# Patient Record
Sex: Female | Born: 1970 | Race: White | Hispanic: No | Marital: Single | Smoking: Never smoker
Health system: Southern US, Community
[De-identification: ages and names within clinical notes are randomized; demographics above are authoritative.]

## PROBLEM LIST (undated history)

## (undated) DIAGNOSIS — M109 Gout, unspecified: Secondary | ICD-10-CM

## (undated) DIAGNOSIS — G43909 Migraine, unspecified, not intractable, without status migrainosus: Secondary | ICD-10-CM

## (undated) DIAGNOSIS — I10 Essential (primary) hypertension: Secondary | ICD-10-CM

## (undated) DIAGNOSIS — N159 Renal tubulo-interstitial disease, unspecified: Secondary | ICD-10-CM

## (undated) DIAGNOSIS — A4902 Methicillin resistant Staphylococcus aureus infection, unspecified site: Secondary | ICD-10-CM

## (undated) HISTORY — PX: OTHER SURGICAL HISTORY: SHX169

---

## 2003-02-04 ENCOUNTER — Emergency Department (HOSPITAL_COMMUNITY): Admission: EM | Admit: 2003-02-04 | Discharge: 2003-02-04 | Payer: Self-pay | Admitting: Emergency Medicine

## 2006-01-08 ENCOUNTER — Emergency Department (HOSPITAL_COMMUNITY): Admission: EM | Admit: 2006-01-08 | Discharge: 2006-01-08 | Payer: Self-pay | Admitting: Emergency Medicine

## 2007-10-24 ENCOUNTER — Emergency Department (HOSPITAL_COMMUNITY): Admission: EM | Admit: 2007-10-24 | Discharge: 2007-10-24 | Payer: Self-pay | Admitting: Emergency Medicine

## 2007-10-31 ENCOUNTER — Ambulatory Visit (HOSPITAL_COMMUNITY): Admission: RE | Admit: 2007-10-31 | Discharge: 2007-10-31 | Payer: Self-pay | Admitting: General Surgery

## 2007-10-31 ENCOUNTER — Encounter (INDEPENDENT_AMBULATORY_CARE_PROVIDER_SITE_OTHER): Payer: Self-pay | Admitting: General Surgery

## 2008-03-05 ENCOUNTER — Emergency Department (HOSPITAL_COMMUNITY): Admission: EM | Admit: 2008-03-05 | Discharge: 2008-03-05 | Payer: Self-pay | Admitting: Emergency Medicine

## 2008-04-08 ENCOUNTER — Emergency Department (HOSPITAL_COMMUNITY): Admission: EM | Admit: 2008-04-08 | Discharge: 2008-04-08 | Payer: Self-pay | Admitting: Emergency Medicine

## 2008-07-09 ENCOUNTER — Emergency Department (HOSPITAL_COMMUNITY): Admission: EM | Admit: 2008-07-09 | Discharge: 2008-07-09 | Payer: Self-pay | Admitting: Emergency Medicine

## 2008-11-09 ENCOUNTER — Emergency Department (HOSPITAL_COMMUNITY): Admission: EM | Admit: 2008-11-09 | Discharge: 2008-11-09 | Payer: Self-pay | Admitting: Emergency Medicine

## 2009-03-07 ENCOUNTER — Emergency Department (HOSPITAL_COMMUNITY): Admission: EM | Admit: 2009-03-07 | Discharge: 2009-03-07 | Payer: Self-pay | Admitting: Emergency Medicine

## 2009-07-30 ENCOUNTER — Emergency Department (HOSPITAL_COMMUNITY): Admission: EM | Admit: 2009-07-30 | Discharge: 2009-07-30 | Payer: Self-pay | Admitting: Emergency Medicine

## 2010-06-18 ENCOUNTER — Emergency Department (HOSPITAL_COMMUNITY)
Admission: EM | Admit: 2010-06-18 | Discharge: 2010-06-18 | Payer: Self-pay | Source: Home / Self Care | Admitting: Emergency Medicine

## 2010-06-21 ENCOUNTER — Emergency Department (HOSPITAL_COMMUNITY)
Admission: EM | Admit: 2010-06-21 | Discharge: 2010-06-21 | Payer: Self-pay | Source: Home / Self Care | Admitting: Emergency Medicine

## 2010-07-28 ENCOUNTER — Emergency Department (HOSPITAL_COMMUNITY)
Admission: EM | Admit: 2010-07-28 | Discharge: 2010-07-28 | Disposition: A | Discharge status: Home / Self Care | Payer: Self-pay | Attending: Emergency Medicine | Admitting: Emergency Medicine

## 2010-07-28 DIAGNOSIS — N949 Unspecified condition associated with female genital organs and menstrual cycle: Secondary | ICD-10-CM | POA: Insufficient documentation

## 2010-07-28 DIAGNOSIS — N938 Other specified abnormal uterine and vaginal bleeding: Secondary | ICD-10-CM | POA: Insufficient documentation

## 2010-07-28 LAB — CBC
HCT: 40.5 % (ref 36.0–46.0)
MCH: 30.4 pg (ref 26.0–34.0)
RBC: 4.64 MIL/uL (ref 3.87–5.11)
RDW: 12.7 % (ref 11.5–15.5)
WBC: 9.2 10*3/uL (ref 4.0–10.5)

## 2010-07-28 LAB — BASIC METABOLIC PANEL
Calcium: 9 mg/dL (ref 8.4–10.5)
Creatinine, Ser: 0.8 mg/dL (ref 0.4–1.2)
GFR calc Af Amer: >60 mL/min (ref >60)
GFR calc non Af Amer: >60 mL/min (ref >60)
Glucose, Bld: 89 mg/dL (ref 70–99)
Sodium: 138 mEq/L (ref 135–145)

## 2010-07-28 LAB — URINALYSIS, ROUTINE W REFLEX MICROSCOPIC
Specific Gravity, Urine: >1.03 — ABNORMAL HIGH (ref 1.005–1.030)
Urobilinogen, UA: 0.2 mg/dL (ref 0.0–1.0)

## 2010-07-28 LAB — DIFFERENTIAL
Basophils Absolute: 0 10*3/uL (ref 0.0–0.1)
Basophils Relative: 0 % (ref 0–1)
Eosinophils Absolute: 0.1 10*3/uL (ref 0.0–0.7)
Neutro Abs: 5.7 10*3/uL (ref 1.7–7.7)

## 2010-07-28 LAB — URINE MICROSCOPIC-ADD ON

## 2010-07-28 LAB — POCT PREGNANCY, URINE: Preg Test, Ur: NEGATIVE

## 2010-09-05 ENCOUNTER — Emergency Department (HOSPITAL_COMMUNITY)
Admission: EM | Admit: 2010-09-05 | Discharge: 2010-09-05 | Disposition: A | Discharge status: Home / Self Care | Payer: Self-pay | Attending: Emergency Medicine | Admitting: Emergency Medicine

## 2010-09-05 DIAGNOSIS — I10 Essential (primary) hypertension: Secondary | ICD-10-CM | POA: Insufficient documentation

## 2010-09-05 DIAGNOSIS — N390 Urinary tract infection, site not specified: Secondary | ICD-10-CM | POA: Insufficient documentation

## 2010-09-05 DIAGNOSIS — M109 Gout, unspecified: Secondary | ICD-10-CM | POA: Insufficient documentation

## 2010-09-05 DIAGNOSIS — E876 Hypokalemia: Secondary | ICD-10-CM | POA: Insufficient documentation

## 2010-09-05 DIAGNOSIS — R51 Headache: Secondary | ICD-10-CM | POA: Insufficient documentation

## 2010-09-05 LAB — URINE MICROSCOPIC-ADD ON

## 2010-09-05 LAB — URINALYSIS, ROUTINE W REFLEX MICROSCOPIC
Glucose, UA: NEGATIVE mg/dL
Ketones, ur: NEGATIVE mg/dL
Leukocytes, UA: NEGATIVE
Nitrite: POSITIVE — AB
Specific Gravity, Urine: >1.03 — ABNORMAL HIGH (ref 1.005–1.030)
pH: 6 (ref 5.0–8.0)

## 2010-09-05 LAB — BASIC METABOLIC PANEL
Calcium: 7.8 mg/dL — ABNORMAL LOW (ref 8.4–10.5)
Glucose, Bld: 79 mg/dL (ref 70–99)

## 2010-09-05 LAB — POCT PREGNANCY, URINE: Preg Test, Ur: NEGATIVE

## 2010-09-06 LAB — BASIC METABOLIC PANEL
BUN: 19 mg/dL (ref 6–23)
CO2: 28 mEq/L (ref 19–32)
Chloride: 103 mEq/L (ref 96–112)
Creatinine, Ser: 1.03 mg/dL (ref 0.4–1.2)
GFR calc non Af Amer: 60 mL/min — ABNORMAL LOW (ref >60)
Glucose, Bld: 126 mg/dL — ABNORMAL HIGH (ref 70–99)
Potassium: 3.1 mEq/L — ABNORMAL LOW (ref 3.5–5.1)
Sodium: 136 mEq/L (ref 135–145)

## 2010-09-06 LAB — URINE MICROSCOPIC-ADD ON

## 2010-09-06 LAB — URINALYSIS, ROUTINE W REFLEX MICROSCOPIC
Glucose, UA: NEGATIVE mg/dL
Leukocytes, UA: NEGATIVE
Protein, ur: NEGATIVE mg/dL

## 2010-09-06 LAB — URINE CULTURE
Colony Count: NO GROWTH
Culture: NO GROWTH

## 2010-09-06 LAB — DIFFERENTIAL
Basophils Relative: 1 % (ref 0–1)
Lymphs Abs: 2.4 10*3/uL (ref 0.7–4.0)
Neutro Abs: 5.3 10*3/uL (ref 1.7–7.7)

## 2010-09-06 LAB — CBC
MCV: 89.3 fL (ref 78.0–100.0)
Platelets: 307 10*3/uL (ref 150–400)
RBC: 4.51 MIL/uL (ref 3.87–5.11)

## 2010-09-06 LAB — RAPID URINE DRUG SCREEN, HOSP PERFORMED
Barbiturates: NOT DETECTED
Tetrahydrocannabinol: NOT DETECTED

## 2010-09-06 LAB — PREGNANCY, URINE: Preg Test, Ur: NEGATIVE

## 2010-09-11 LAB — URINE CULTURE: Culture  Setup Time: 201204100014

## 2010-10-12 NOTE — Op Note (Signed)
Joanna Perry, Joanna Perry            ACCOUNT NO.:  [id number]   MEDICAL RECORD NO.:  [id number]          PATIENT TYPE:  AMB   LOCATION:  DAY                           FACILITY:  [facility]   PHYSICIAN:  Barbaraann Barthel, M.D. DATE OF BIRTH:  July 18, 1970   DATE OF PROCEDURE:  10/31/2007  DATE OF DISCHARGE:                               OPERATIVE REPORT   PREOPERATIVE DIAGNOSIS:  Left posterior knee abscess.   POSTOPERATIVE DIAGNOSIS:  Left posterior knee abscess.   PROCEDURE:  incision and drainage and debridement of left posterior knee  abscess.   NOTE:  This is a 40 year old white female who was seen multiple times in  [place] in the emergency room and could not get an appointment with a  surgeon there in [place] and came to our office and we scheduled her for  excision of an obvious necrotic abscess on the left posterior knee.  We  discussed complications not limited to but including bleeding,  infection, and the possibility of further surgery might be required.  Informed consent was obtained.   SPECIMENS:  Debrided tissue and cultures.   TECHNIQUE:  The patient was placed in the right lateral decubitus  position.  We prepped the left leg with Betadine solution and draped in  the usual manner after general LMA anesthesia was obtained.  We excised  and debrided and curetted the abscess cavity after cultures were  obtained and controlled the bleeding with a cautery device and packed  the wound open with saline-soaked gauze.  We made followup arrangements  for wound care.  We will follow her postoperatively as needed for wound  care.      Barbaraann Barthel, M.D.  Electronically Signed     WB/MEDQ  D:  10/31/2007  T:  11/01/2007  Job:  [id number]   cc:   Mariella Saa, MD  [facility]  [address] [place], [place] [address]

## 2011-02-24 LAB — DIFFERENTIAL
Basophils Absolute: 0
Eosinophils Absolute: 0.1
Lymphs Abs: 1.9
Neutrophils Relative %: 59

## 2011-02-24 LAB — BASIC METABOLIC PANEL
BUN: 9
Calcium: 9.3
Chloride: 108
GFR calc Af Amer: >60
Potassium: 4.1

## 2011-02-24 LAB — CULTURE, ROUTINE-ABSCESS: Gram Stain: NONE SEEN

## 2011-02-24 LAB — RAPID URINE DRUG SCREEN, HOSP PERFORMED
Amphetamines: NOT DETECTED
Barbiturates: NOT DETECTED
Opiates: POSITIVE — AB

## 2011-02-24 LAB — ANAEROBIC CULTURE

## 2011-02-24 LAB — CBC
MCV: 88.3
Platelets: 339
RDW: 12.6
WBC: 6

## 2011-11-23 ENCOUNTER — Encounter (HOSPITAL_COMMUNITY): Payer: Self-pay | Admitting: *Deleted

## 2011-11-23 ENCOUNTER — Emergency Department (HOSPITAL_COMMUNITY)
Admission: EM | Admit: 2011-11-23 | Discharge: 2011-11-23 | Disposition: A | Discharge status: Home / Self Care | Payer: Self-pay | Attending: Emergency Medicine | Admitting: Emergency Medicine

## 2011-11-23 ENCOUNTER — Emergency Department (HOSPITAL_COMMUNITY): Payer: Self-pay

## 2011-11-23 DIAGNOSIS — G8929 Other chronic pain: Secondary | ICD-10-CM | POA: Insufficient documentation

## 2011-11-23 DIAGNOSIS — N390 Urinary tract infection, site not specified: Secondary | ICD-10-CM | POA: Insufficient documentation

## 2011-11-23 DIAGNOSIS — Z79899 Other long term (current) drug therapy: Secondary | ICD-10-CM | POA: Insufficient documentation

## 2011-11-23 HISTORY — DX: Renal tubulo-interstitial disease, unspecified: N15.9

## 2011-11-23 HISTORY — DX: Gout, unspecified: M10.9

## 2011-11-23 HISTORY — DX: Methicillin resistant Staphylococcus aureus infection, unspecified site: A49.02

## 2011-11-23 HISTORY — DX: Migraine, unspecified, not intractable, without status migrainosus: G43.909

## 2011-11-23 LAB — COMPREHENSIVE METABOLIC PANEL
AST: 12 U/L (ref 0–37)
Albumin: 3.4 g/dL — ABNORMAL LOW (ref 3.5–5.2)
BUN: 11 mg/dL (ref 6–23)
Calcium: 9.5 mg/dL (ref 8.4–10.5)
Chloride: 102 mEq/L (ref 96–112)
Creatinine, Ser: 0.9 mg/dL (ref 0.50–1.10)
Total Bilirubin: 0.2 mg/dL — ABNORMAL LOW (ref 0.3–1.2)

## 2011-11-23 LAB — URINALYSIS, ROUTINE W REFLEX MICROSCOPIC
Glucose, UA: NEGATIVE mg/dL
Ketones, ur: NEGATIVE mg/dL
Nitrite: NEGATIVE
Protein, ur: NEGATIVE mg/dL
pH: 6 (ref 5.0–8.0)

## 2011-11-23 LAB — LIPASE, BLOOD: Lipase: 29 U/L (ref 11–59)

## 2011-11-23 LAB — CBC WITH DIFFERENTIAL/PLATELET
Basophils Absolute: 0 10*3/uL (ref 0.0–0.1)
Basophils Relative: 0 % (ref 0–1)
Eosinophils Absolute: 0.1 10*3/uL (ref 0.0–0.7)
Eosinophils Relative: 2 % (ref 0–5)
HCT: 41.1 % (ref 36.0–46.0)
Hemoglobin: 14.3 g/dL (ref 12.0–15.0)
MCH: 30.2 pg (ref 26.0–34.0)
MCHC: 34.8 g/dL (ref 30.0–36.0)
MCV: 86.7 fL (ref 78.0–100.0)
Monocytes Absolute: 0.8 10*3/uL (ref 0.1–1.0)
Monocytes Relative: 8 % (ref 3–12)
RDW: 12.6 % (ref 11.5–15.5)

## 2011-11-23 LAB — TROPONIN I: Troponin I: <0.3 ng/mL (ref <0.30)

## 2011-11-23 MED ORDER — SODIUM CHLORIDE 0.9 % IV BOLUS (SEPSIS)
250.0000 mL | Freq: Once | INTRAVENOUS | Status: AC
  Administered 2011-11-23: 14:00:00 via INTRAVENOUS

## 2011-11-23 MED ORDER — SODIUM CHLORIDE 0.9 % IV SOLN: INTRAVENOUS | Status: DC

## 2011-11-23 MED ORDER — HYDROMORPHONE HCL PF 1 MG/ML IJ SOLN
1.0000 mg | Freq: Once | INTRAMUSCULAR | Status: AC
  Administered 2011-11-23: 1 mg via INTRAVENOUS
  Filled 2011-11-23: qty 1

## 2011-11-23 MED ORDER — ONDANSETRON HCL 4 MG/2ML IJ SOLN
4.0000 mg | Freq: Once | INTRAMUSCULAR | Status: AC
  Administered 2011-11-23: 4 mg via INTRAVENOUS
  Filled 2011-11-23: qty 2

## 2011-11-23 MED ORDER — FLUCONAZOLE 100 MG PO TABS
100.0000 mg | ORAL_TABLET | Freq: Once | ORAL | Status: AC
  Administered 2011-11-23: 100 mg via ORAL
  Filled 2011-11-23: qty 1

## 2011-11-23 MED ORDER — CEPHALEXIN 500 MG PO CAPS: 500.0000 mg | ORAL_CAPSULE | Freq: Four times a day (QID) | ORAL | Status: AC

## 2011-11-23 MED ORDER — METRONIDAZOLE 500 MG PO TABS
2000.0000 mg | ORAL_TABLET | Freq: Once | ORAL | Status: AC
  Administered 2011-11-23: 2000 mg via ORAL
  Filled 2011-11-23: qty 4

## 2011-11-23 MED ORDER — AZITHROMYCIN 250 MG PO TABS
1000.0000 mg | ORAL_TABLET | Freq: Once | ORAL | Status: AC
  Administered 2011-11-23: 1000 mg via ORAL
  Filled 2011-11-23: qty 4

## 2011-11-23 MED ORDER — DEXTROSE 5 % IV SOLN
1.0000 g | Freq: Once | INTRAVENOUS | Status: AC
  Administered 2011-11-23: 1 g via INTRAVENOUS
  Filled 2011-11-23: qty 10

## 2011-11-23 MED ORDER — OXYCODONE-ACETAMINOPHEN 5-325 MG PO TABS: 1.0000 | ORAL_TABLET | ORAL | Status: AC | PRN

## 2011-11-23 NOTE — ED Notes (Signed)
Rt flank pain for 3 days,  Diarrhea for 1 week, NV since yesterday. No injury.

## 2011-11-23 NOTE — ED Notes (Signed)
edp stated to given meds as ordered. Did not think she had an allergic reaction on previous visit

## 2011-11-23 NOTE — Discharge Instructions (Signed)
Workup today looks like urinary tract infection take antibiotic as directed return to emergency part not improving in 2 days. Take pain medicine as directed. Other medications were given you in the emergency department to cover Trichomonas or other STDs. Given a dose of Rocephin IV piggyback in the emergency department a jump start the healing of the urinary tract infection.Urinary Tract Infection A urinary tract infection (UTI) is often caused by a germ (bacteria). A UTI is usually helped with medicine (antibiotics) that kills germs. Take all the medicine until it is gone. Do this even if you are feeling better. You are usually better in 7 to 10 days. HOME CARE   Drink enough water and fluids to keep your pee (urine) clear or pale yellow. Drink:   Cranberry juice.   Water.   Avoid:   Caffeine.   Tea.   Bubbly (carbonated) drinks.   Alcohol.   Only take medicine as told by your doctor.   To prevent further infections:   Pee often.   After pooping (bowel movement), women should wipe from front to back. Use each tissue only once.   Pee before and after having sex (intercourse).  Ask your doctor when your test results will be ready. Make sure you follow up and get your test results.  GET HELP RIGHT AWAY IF:   There is very bad back pain or lower belly (abdominal) pain.   You get the chills.   You have a fever.   Your baby is older than 3 months with a rectal temperature of 102 F (38.9 C) or higher.   Your baby is 30 months old or younger with a rectal temperature of 100.4 F (38 C) or higher.   You feel sick to your stomach (nauseous) or throw up (vomit).   There is continued burning with peeing.   Your problems are not better in 3 days. Return sooner if you are getting worse.  MAKE SURE YOU:   Understand these instructions.   Will watch your condition.   Will get help right away if you are not doing well or get worse.  Document Released: 11/02/2007 Document  Revised: 05/05/2011 Document Reviewed: 11/02/2007 [facility] Patient Information 2012 [place], [place].

## 2011-11-23 NOTE — ED Provider Notes (Signed)
History   This chart was scribed for Shelda Jakes, MD by Clarita Crane. The patient was seen in room [facility]. Patient's care was started at 1322.    CSN: [id number]  Arrival date & time 11/23/11  1322   First MD Initiated Contact with Patient 11/23/11 1335      Chief Complaint  Patient presents with  . Flank Pain    (Consider location/radiation/quality/duration/timing/severity/associated sxs/prior treatment) HPI Joanna Perry is a 41 y.o. female who presents to the Emergency Department complaining of recurrence of chronic moderate left sided flank pain onset 4 days ago and persistent since with associated intermittent fever, visual disturbances, dysuria, abdominal bloating and pain, nausea, vomiting, diarrhea and hematochezia. Patient note she has experienced episodes of similar flank pain for the past 3-4 years. Patient also reports experiencing constant chest pain localized to left side of chest for the past 2 days. Denies fever, chills, hematemesis, sore throat, congestion, rash, bleeding easily, myalgias. Patient admits to using cocaine 2 weeks ago. Patient also notes she is currently being evaluated for abnormal vaginal bleeding (menstrual period every other week) and is recommended to have abdominal hysterectomy). Patient with h/o migraine, kidney infection, MRSA infection, gout, D and C x8.  Past Medical History  Diagnosis Date  . Gout   . Migraine   . Kidney infection   . MRSA infection     History reviewed. No pertinent past surgical history.  History reviewed. No pertinent family history.  History  Substance Use Topics  . Smoking status: Not on file  . Smokeless tobacco: Not on file  . Alcohol Use: No    OB History    Grav Para Term Preterm Abortions TAB SAB Ect Mult Living                  Review of Systems  Constitutional: Positive for fever. Negative for chills.  HENT: Negative for congestion, sore throat, rhinorrhea and neck pain.   Eyes:  Positive for visual disturbance. Negative for pain.  Respiratory: Negative for cough and shortness of breath.   Cardiovascular: Positive for chest pain. Negative for leg swelling.  Gastrointestinal: Positive for nausea, vomiting, abdominal pain, diarrhea and blood in stool.  Genitourinary: Positive for dysuria and flank pain.  Musculoskeletal: Negative for myalgias and back pain.  Skin: Negative for rash.  Neurological: Negative for weakness and headaches.    Allergies  Codeine and Tramadol  Home Medications   Current Outpatient Rx  Name Route Sig Dispense Refill  . AMLODIPINE BESYLATE 10 MG PO TABS Oral Take 10 mg by mouth daily.    Marland Kitchen CITALOPRAM HYDROBROMIDE 20 MG PO TABS Oral Take 20 mg by mouth daily.    Marland Kitchen CLONAZEPAM 1 MG PO TABS Oral Take 1 mg by mouth 2 (two) times daily as needed.    Marland Kitchen DIPHENHYDRAMINE HCL 25 MG PO CAPS Oral Take 25 mg by mouth every 6 (six) hours as needed. For allergic reaction    . HYDROXYZINE PAMOATE 25 MG PO CAPS Oral Take 25 mg by mouth at bedtime as needed. For sleep    . LISINOPRIL 20 MG PO TABS Oral Take 20 mg by mouth daily.    . TOPAMAX PO Oral Take 1 tablet by mouth daily.    . CEPHALEXIN 500 MG PO CAPS Oral Take 1 capsule (500 mg total) by mouth 4 (four) times daily. 28 capsule 0    BP 135/95  Pulse 82  Temp 97.9 F (36.6 C) (Oral)  Resp 20  Ht 5\' 1"  (1.549 m)  Wt 165 lb (74.844 kg)  BMI 31.18 kg/m2  SpO2 100%  LMP 10/23/2011  Physical Exam  Nursing note and vitals reviewed. Constitutional: She is oriented to person, place, and time. She appears well-developed and well-nourished. No distress.  HENT:  Head: Normocephalic and atraumatic.  Mouth/Throat: Oropharynx is clear and moist.       Mucous membranes moist.   Eyes: EOM are normal. Pupils are equal, round, and reactive to light.  Neck: Neck supple. No tracheal deviation present.  Cardiovascular: Normal rate and regular rhythm.  Exam reveals no gallop and no friction rub.   No murmur  heard. Pulmonary/Chest: Effort normal. No respiratory distress. She has no wheezes. She has no rales.  Abdominal: Soft. She exhibits no distension. There is tenderness (left flank) in the left lower quadrant.  Musculoskeletal: Normal range of motion. She exhibits no edema.  Neurological: She is alert and oriented to person, place, and time. No cranial nerve deficit or sensory deficit.  Skin: Skin is warm and dry.  Psychiatric: She has a normal mood and affect. Her behavior is normal.    ED Course  Procedures (including critical care time)  DIAGNOSTIC STUDIES: Oxygen Saturation is 100% on room air, normal by my interpretation.    COORDINATION OF CARE: 1:54PM-Patient informed of current plan for treatment and evaluation and agrees with plan at this time.  2:37PM- Shelda Jakes, MD to patient bedside to verify allergies.   Date: 11/23/2011  Rate: 82  Rhythm: normal sinus rhythm  QRS Axis: normal  Intervals: normal  ST/T Wave abnormalities: normal  Conduction Disutrbances:none  Narrative Interpretation:   Old EKG Reviewed: unchanged From 11/09/08   Labs Reviewed  COMPREHENSIVE METABOLIC PANEL - Abnormal; Notable for the following:    Glucose, Bld 102 (*)     Albumin 3.4 (*)     Total Bilirubin 0.2 (*)     GFR calc non Af Amer 78 (*)     All other components within normal limits  URINALYSIS, ROUTINE W REFLEX MICROSCOPIC - Abnormal; Notable for the following:    Leukocytes, UA SMALL (*)     All other components within normal limits  URINE MICROSCOPIC-ADD ON - Abnormal; Notable for the following:    Squamous Epithelial / LPF MANY (*)     Bacteria, UA MANY (*)     All other components within normal limits  CBC WITH DIFFERENTIAL  LIPASE, BLOOD  TROPONIN I  PREGNANCY, URINE  URINE CULTURE   Dg Abd Acute W/chest  11/23/2011  *RADIOLOGY REPORT*  Clinical Data: Left abdominal pain.  History kidney stones.  ACUTE ABDOMEN SERIES (ABDOMEN 2 VIEW & CHEST 1 VIEW)  Comparison:  08/11/2011  Findings: Chest radiograph demonstrates clear lungs. Heart and mediastinum are within normal limits.  The trachea is midline. There is stool in the rectal region and right colon.  No significant small bowel gas.  Limited evaluation for kidney stones.  IMPRESSION: No acute chest findings.  Nonobstructive bowel gas pattern.  Colonic stool.  Original Report Authenticated By: Richarda Overlie, M.D.   Results for orders placed during the hospital encounter of 11/23/11  CBC WITH DIFFERENTIAL      Component Value Range   WBC 9.2  4.0 - 10.5 K/uL   RBC 4.74  3.87 - 5.11 MIL/uL   Hemoglobin 14.3  12.0 - 15.0 g/dL   HCT 16.1  09.6 - 04.5 %   MCV 86.7  78.0 - 100.0 fL   MCH  30.2  26.0 - 34.0 pg   MCHC 34.8  30.0 - 36.0 g/dL   RDW 13.0  86.5 - 78.4 %   Platelets 298  150 - 400 K/uL   Neutrophils Relative 64  43 - 77 %   Neutro Abs 5.9  1.7 - 7.7 K/uL   Lymphocytes Relative 26  12 - 46 %   Lymphs Abs 2.4  0.7 - 4.0 K/uL   Monocytes Relative 8  3 - 12 %   Monocytes Absolute 0.8  0.1 - 1.0 K/uL   Eosinophils Relative 2  0 - 5 %   Eosinophils Absolute 0.1  0.0 - 0.7 K/uL   Basophils Relative 0  0 - 1 %   Basophils Absolute 0.0  0.0 - 0.1 K/uL  COMPREHENSIVE METABOLIC PANEL      Component Value Range   Sodium 136  135 - 145 mEq/L   Potassium 3.7  3.5 - 5.1 mEq/L   Chloride 102  96 - 112 mEq/L   CO2 23  19 - 32 mEq/L   Glucose, Bld 102 (*) 70 - 99 mg/dL   BUN 11  6 - 23 mg/dL   Creatinine, Ser 6.96  0.50 - 1.10 mg/dL   Calcium 9.5  8.4 - 29.5 mg/dL   Total Protein 7.1  6.0 - 8.3 g/dL   Albumin 3.4 (*) 3.5 - 5.2 g/dL   AST 12  0 - 37 U/L   ALT 13  0 - 35 U/L   Alkaline Phosphatase 61  39 - 117 U/L   Total Bilirubin 0.2 (*) 0.3 - 1.2 mg/dL   GFR calc non Af Amer 78 (*) >90 mL/min   GFR calc Af Amer >90  >90 mL/min  LIPASE, BLOOD      Component Value Range   Lipase 29  11 - 59 U/L  URINALYSIS, ROUTINE W REFLEX MICROSCOPIC      Component Value Range   Color, Urine YELLOW  YELLOW    APPearance CLEAR  CLEAR   Specific Gravity, Urine 1.025  1.005 - 1.030   pH 6.0  5.0 - 8.0   Glucose, UA NEGATIVE  NEGATIVE mg/dL   Hgb urine dipstick NEGATIVE  NEGATIVE   Bilirubin Urine NEGATIVE  NEGATIVE   Ketones, ur NEGATIVE  NEGATIVE mg/dL   Protein, ur NEGATIVE  NEGATIVE mg/dL   Urobilinogen, UA 0.2  0.0 - 1.0 mg/dL   Nitrite NEGATIVE  NEGATIVE   Leukocytes, UA SMALL (*) NEGATIVE  TROPONIN I      Component Value Range   Troponin I <0.30  <0.30 ng/mL  PREGNANCY, URINE      Component Value Range   Preg Test, Ur NEGATIVE  NEGATIVE  URINE MICROSCOPIC-ADD ON      Component Value Range   Squamous Epithelial / LPF MANY (*) RARE   WBC, UA 3-6  <3 WBC/hpf   RBC / HPF 3-6  <3 RBC/hpf   Bacteria, UA MANY (*) RARE     1. Urinary tract infection       MDM  Patient with complaint of left flank pain history of urinary tract infections workup in the emergency room without significant findings except for abnormal urinalysis not consistent perhaps with urinary tract infection but did have many epithelial cells we'll treat as as such if not better in 2 days will have her return CT scan of the abdomen required at that time. No acute surgical abdomen at this point. No leukocytosis. CT chest is negative patient also  complained of chest pain troponin is negative patient later stated that her boyfriend has had STDs and has not been treated therefore go ahead and add on Zithromax Flagyl to cover Chlamydia and Trichomonas. No evidence for pneumonias in the urine. Patient denies any vaginal discharge. Symptoms could be consistent with a left-sided kidney stone x-rays without evidence of pneumonia bowel obstruction or any other abnormalities. If related to kidney stone pain medicine may help that if not better in 2 days patient will return to emergency car for further workup. Today her CT scan is not functional the patient's condition does not ward transfer to [facility] at this time to have CT scan  done.   I personally performed the services described in this documentation, which was scribed in my presence. The recorded information has been reviewed and considered.     Shelda Jakes, MD 11/23/11 [phone]

## 2011-11-23 NOTE — ED Notes (Signed)
pts meds held at this time, ?allergy to dilaudid, md notifiy and will speak to pt

## 2011-11-23 NOTE — ED Notes (Signed)
Patient with no complaints at this time. Respirations even and unlabored. Skin warm/dry. Discharge instructions reviewed with patient at this time. Patient given opportunity to voice concerns/ask questions. IV removed per policy and band-aid applied to site. Patient discharged at this time and left Emergency Department with steady gait.  

## 2011-11-23 NOTE — ED Notes (Signed)
Pt stated she has been using crack for 20 years or more and "jst can't quit", "all my friends have stopped crack and use narcotics"  Family member at bedside throughout.

## 2011-11-23 NOTE — ED Notes (Signed)
Awaiting antibiotics to finish prior to discharge.

## 2011-11-25 LAB — URINE CULTURE: Colony Count: 50000

## 2011-12-26 ENCOUNTER — Encounter (HOSPITAL_COMMUNITY): Payer: Self-pay | Admitting: *Deleted

## 2011-12-26 ENCOUNTER — Emergency Department (HOSPITAL_COMMUNITY): Payer: Self-pay

## 2011-12-26 ENCOUNTER — Emergency Department (HOSPITAL_COMMUNITY)
Admission: EM | Admit: 2011-12-26 | Discharge: 2011-12-26 | Disposition: A | Discharge status: Home / Self Care | Payer: Self-pay | Attending: Emergency Medicine | Admitting: Emergency Medicine

## 2011-12-26 DIAGNOSIS — R109 Unspecified abdominal pain: Secondary | ICD-10-CM

## 2011-12-26 DIAGNOSIS — Z79899 Other long term (current) drug therapy: Secondary | ICD-10-CM | POA: Insufficient documentation

## 2011-12-26 DIAGNOSIS — R1012 Left upper quadrant pain: Secondary | ICD-10-CM | POA: Insufficient documentation

## 2011-12-26 LAB — COMPREHENSIVE METABOLIC PANEL
ALT: 10 U/L (ref 0–35)
AST: 15 U/L (ref 0–37)
Alkaline Phosphatase: 55 U/L (ref 39–117)
CO2: 26 mEq/L (ref 19–32)
Calcium: 9.1 mg/dL (ref 8.4–10.5)
Chloride: 105 mEq/L (ref 96–112)
GFR calc non Af Amer: >90 mL/min (ref >90)
Glucose, Bld: 91 mg/dL (ref 70–99)
Potassium: 3.8 mEq/L (ref 3.5–5.1)
Sodium: 139 mEq/L (ref 135–145)

## 2011-12-26 LAB — CBC WITH DIFFERENTIAL/PLATELET
Basophils Absolute: 0 10*3/uL (ref 0.0–0.1)
Eosinophils Relative: 2 % (ref 0–5)
Lymphocytes Relative: 31 % (ref 12–46)
Lymphs Abs: 2.1 10*3/uL (ref 0.7–4.0)
Neutro Abs: 4 10*3/uL (ref 1.7–7.7)
Neutrophils Relative %: 60 % (ref 43–77)
Platelets: 269 10*3/uL (ref 150–400)
RBC: 4.56 MIL/uL (ref 3.87–5.11)
RDW: 12.9 % (ref 11.5–15.5)
WBC: 6.7 10*3/uL (ref 4.0–10.5)

## 2011-12-26 LAB — URINALYSIS, ROUTINE W REFLEX MICROSCOPIC
Bilirubin Urine: NEGATIVE
Leukocytes, UA: NEGATIVE
Nitrite: NEGATIVE
Specific Gravity, Urine: 1.025 (ref 1.005–1.030)
Urobilinogen, UA: 0.2 mg/dL (ref 0.0–1.0)
pH: 6.5 (ref 5.0–8.0)

## 2011-12-26 LAB — RAPID URINE DRUG SCREEN, HOSP PERFORMED
Barbiturates: NOT DETECTED
Cocaine: POSITIVE — AB
Tetrahydrocannabinol: NOT DETECTED

## 2011-12-26 LAB — PREGNANCY, URINE: Preg Test, Ur: NEGATIVE

## 2011-12-26 MED ORDER — LISINOPRIL 20 MG PO TABS: 20.0000 mg | ORAL_TABLET | Freq: Every day | ORAL | Status: DC

## 2011-12-26 MED ORDER — OXYCODONE-ACETAMINOPHEN 5-325 MG PO TABS: 1.0000 | ORAL_TABLET | Freq: Four times a day (QID) | ORAL | Status: AC | PRN

## 2011-12-26 MED ORDER — AMLODIPINE BESYLATE 10 MG PO TABS: 10.0000 mg | ORAL_TABLET | Freq: Every day | ORAL | Status: DC

## 2011-12-26 MED ORDER — MORPHINE SULFATE 4 MG/ML IJ SOLN
4.0000 mg | Freq: Once | INTRAMUSCULAR | Status: AC
  Administered 2011-12-26: 4 mg via INTRAVENOUS
  Filled 2011-12-26: qty 1

## 2011-12-26 MED ORDER — IOHEXOL 300 MG/ML  SOLN
100.0000 mL | Freq: Once | INTRAMUSCULAR | Status: AC | PRN
  Administered 2011-12-26: 100 mL via INTRAVENOUS

## 2011-12-26 MED ORDER — SODIUM CHLORIDE 0.9 % IV BOLUS (SEPSIS)
500.0000 mL | Freq: Once | INTRAVENOUS | Status: AC
  Administered 2011-12-26: 11:00:00 via INTRAVENOUS

## 2011-12-26 MED ORDER — ONDANSETRON HCL 4 MG/2ML IJ SOLN
4.0000 mg | Freq: Once | INTRAMUSCULAR | Status: AC
  Administered 2011-12-26: 4 mg via INTRAVENOUS
  Filled 2011-12-26: qty 2

## 2011-12-26 MED ORDER — SODIUM CHLORIDE 0.9 % IV SOLN: INTRAVENOUS | Status: DC

## 2011-12-26 NOTE — ED Notes (Signed)
Pt to CT

## 2011-12-26 NOTE — ED Notes (Signed)
Pt states left flank pain for "months". Was here previously but CT was down. NAD.

## 2011-12-26 NOTE — ED Provider Notes (Signed)
History  This chart was scribed for [employer]. Rubin Payor, MD by Bennett Scrape. This patient was seen in room [facility]05/[facility]05 and the patient's care was started at 10:21AM.  CSN: [id number]  Arrival date & time 12/26/11  1006   First MD Initiated Contact with Patient 12/26/11 1021      Chief Complaint  Patient presents with  . Flank Pain    The history is provided by the patient. No language interpreter was used.   Joanna Perry is a 41 y.o. female who presents to the Emergency Department complaining of recurrent, non-changing constant left flank pain that radiates into the LUQ area with associated intermittent swelling for the past 2 to 3 months. The pain is worse after eating dairy products and greasy foods. She lists nausea, emesis and 29 lb weight gain as associated symptoms and states that the pain is improved after emesis episodes. She has a h/o crack use and relapsed 2 weeks ago to help ease her pain. She also c/o menstrual cycles every two weeks and states that she has been following up with her OBGYN for the symptoms. She states that her OBGYN's solution is a hysterectomy, but she reports that she needs a PCP to follow up with for this procedure. She also expresses concern over a clump of tissue that she passed last week that she attributes to a miscarriage. She denies taking a pregnancy test but states that she has had suspicions. She denies diarrhea, back pain, rash, fever and chills as associated symptoms. She has a h/o gout, migraine and MRSA. She denies smoking and alcohol use.   No PCP.  Past Medical History  Diagnosis Date  . Gout   . Migraine   . Kidney infection   . MRSA infection     History reviewed. No pertinent past surgical history.  No family history on file.  History  Substance Use Topics  . Smoking status: Never Smoker   . Smokeless tobacco: Not on file  . Alcohol Use: No    No OB history provided.  Review of Systems  Constitutional: Positive for  unexpected weight change. Negative for fever and chills.  Respiratory: Negative for cough and shortness of breath.   Gastrointestinal: Positive for nausea, vomiting and abdominal pain. Negative for diarrhea and blood in stool.  Genitourinary: Positive for flank pain and vaginal bleeding. Negative for vaginal discharge.  Musculoskeletal: Negative for back pain.  Skin: Negative for rash.    Allergies  Codeine; Other; Peach; Strawberry; and Tramadol  Home Medications   Current Outpatient Rx  Name Route Sig Dispense Refill  . CITALOPRAM HYDROBROMIDE 20 MG PO TABS Oral Take 20 mg by mouth daily.    Marland Kitchen CLONAZEPAM 1 MG PO TABS Oral Take 1 mg by mouth 2 (two) times daily as needed.    Marland Kitchen DIPHENHYDRAMINE HCL 25 MG PO CAPS Oral Take 25 mg by mouth every 6 (six) hours as needed. For allergic reaction    . HYDROXYZINE PAMOATE 25 MG PO CAPS Oral Take 25 mg by mouth at bedtime as needed. For sleep    . ADULT MULTIVITAMIN W/MINERALS CH Oral Take 1 tablet by mouth daily.    Marland Kitchen NAPROXEN SODIUM 220 MG PO TABS Oral Take 220 mg by mouth every 8 (eight) hours as needed. For pain    . POTASSIUM PO Oral Take 2 tablets by mouth daily.    Marland Kitchen VITAMIN C 500 MG PO TABS Oral Take 500 mg by mouth daily.    Marland Kitchen  AMLODIPINE BESYLATE 10 MG PO TABS Oral Take 1 tablet (10 mg total) by mouth daily. 14 tablet 0  . LISINOPRIL 20 MG PO TABS Oral Take 1 tablet (20 mg total) by mouth daily. 14 tablet 0  . OXYCODONE-ACETAMINOPHEN 5-325 MG PO TABS Oral Take 1-2 tablets by mouth every 6 (six) hours as needed for pain. 8 tablet 0    Triage Vitals: BP 153/106  Pulse 75  Temp 97.8 F (36.6 C) (Oral)  Resp 16  Ht 5\' 1"  (1.549 m)  Wt 165 lb (74.844 kg)  BMI 31.18 kg/m2  SpO2 96%  LMP 12/19/2011  Physical Exam  Nursing note and vitals reviewed. Constitutional: She is oriented to person, place, and time. She appears well-developed and well-nourished. No distress.  HENT:  Head: Normocephalic and atraumatic.  Eyes: EOM are normal.    Neck: Neck supple. No tracheal deviation present.  Cardiovascular: Normal rate, regular rhythm and normal heart sounds.   Pulmonary/Chest: Effort normal and breath sounds normal. No respiratory distress.  Abdominal: Soft. There is tenderness. There is no rebound and no guarding.       Tenderness to the LUQ with less severe tenderness to RLQ  Musculoskeletal: Normal range of motion. She exhibits edema (trace pitting edema) and tenderness.       Left flank tenderness  Neurological: She is alert and oriented to person, place, and time.  Skin: Skin is warm and dry.  Psychiatric: She has a normal mood and affect. Her behavior is normal.    ED Course  Procedures (including critical care time)  COORDINATION OF CARE: 10:38AM-Discussed treatment plan of blood work, CT scan, urinalysis and urine culture with pt at bedside and pt agreed to plan. 12:53PM-Pt rechecked and states that symptoms have improved. Informed pt of lab and radiology results. Discussed discharge plan of pain medications with pt and pt agreed to plan. Advised pt that she may need to follow up with GI specialist. Pt requested a refill for lisinopril.    Results for orders placed during the hospital encounter of 12/26/11  URINALYSIS, ROUTINE W REFLEX MICROSCOPIC      Component Value Range   Color, Urine YELLOW  YELLOW   APPearance CLEAR  CLEAR   Specific Gravity, Urine 1.025  1.005 - 1.030   pH 6.5  5.0 - 8.0   Glucose, UA NEGATIVE  NEGATIVE mg/dL   Hgb urine dipstick NEGATIVE  NEGATIVE   Bilirubin Urine NEGATIVE  NEGATIVE   Ketones, ur NEGATIVE  NEGATIVE mg/dL   Protein, ur NEGATIVE  NEGATIVE mg/dL   Urobilinogen, UA 0.2  0.0 - 1.0 mg/dL   Nitrite NEGATIVE  NEGATIVE   Leukocytes, UA NEGATIVE  NEGATIVE  PREGNANCY, URINE      Component Value Range   Preg Test, Ur NEGATIVE  NEGATIVE  CBC WITH DIFFERENTIAL      Component Value Range   WBC 6.7  4.0 - 10.5 K/uL   RBC 4.56  3.87 - 5.11 MIL/uL   Hemoglobin 13.7  12.0 -  15.0 g/dL   HCT 16.1  09.6 - 04.5 %   MCV 87.1  78.0 - 100.0 fL   MCH 30.0  26.0 - 34.0 pg   MCHC 34.5  30.0 - 36.0 g/dL   RDW 40.9  81.1 - 91.4 %   Platelets 269  150 - 400 K/uL   Neutrophils Relative 60  43 - 77 %   Neutro Abs 4.0  1.7 - 7.7 K/uL   Lymphocytes Relative 31  12 -  46 %   Lymphs Abs 2.1  0.7 - 4.0 K/uL   Monocytes Relative 8  3 - 12 %   Monocytes Absolute 0.5  0.1 - 1.0 K/uL   Eosinophils Relative 2  0 - 5 %   Eosinophils Absolute 0.1  0.0 - 0.7 K/uL   Basophils Relative 0  0 - 1 %   Basophils Absolute 0.0  0.0 - 0.1 K/uL  COMPREHENSIVE METABOLIC PANEL      Component Value Range   Sodium 139  135 - 145 mEq/L   Potassium 3.8  3.5 - 5.1 mEq/L   Chloride 105  96 - 112 mEq/L   CO2 26  19 - 32 mEq/L   Glucose, Bld 91  70 - 99 mg/dL   BUN 11  6 - 23 mg/dL   Creatinine, Ser 1.61  0.50 - 1.10 mg/dL   Calcium 9.1  8.4 - 09.6 mg/dL   Total Protein 6.9  6.0 - 8.3 g/dL   Albumin 3.3 (*) 3.5 - 5.2 g/dL   AST 15  0 - 37 U/L   ALT 10  0 - 35 U/L   Alkaline Phosphatase 55  39 - 117 U/L   Total Bilirubin 0.2 (*) 0.3 - 1.2 mg/dL   GFR calc non Af Amer >90  >90 mL/min   GFR calc Af Amer >90  >90 mL/min  LIPASE, BLOOD      Component Value Range   Lipase 32  11 - 59 U/L  URINE RAPID DRUG SCREEN (HOSP PERFORMED)      Component Value Range   Opiates NONE DETECTED  NONE DETECTED   Cocaine POSITIVE (*) NONE DETECTED   Benzodiazepines POSITIVE (*) NONE DETECTED   Amphetamines NONE DETECTED  NONE DETECTED   Tetrahydrocannabinol NONE DETECTED  NONE DETECTED   Barbiturates NONE DETECTED  NONE DETECTED   Ct Abdomen Pelvis W Contrast  12/26/2011  *RADIOLOGY REPORT*  Clinical Data: Left flank and abdominal pain.  CT ABDOMEN AND PELVIS WITH CONTRAST  Technique:  Multidetector CT imaging of the abdomen and pelvis was performed following the standard protocol during bolus administration of intravenous contrast.  Contrast: OMNIPAQUE IOHEXOL 300 MG/ML  SOLN  Comparison: Radiography  11/23/2011  Findings: Lung bases are clear except for minimal dependent scarring or atelectasis.  No pleural or pericardial fluid.  There is a small hiatal hernia.  The liver has a normal appearance without focal lesions or biliary ductal dilatation.  No calcified gallstones.  The spleen is normal. The pancreas is normal.  The adrenal glands are normal.  The kidneys are normal.  The aorta and IVC are normal.  No retroperitoneal mass or adenopathy.  No free intraperitoneal fluid or air.  The bladder appears normal.  Uterus and adnexal regions appear normal.  No bowel pathology is seen.  The appendix is normal.  No significant bony finding.  IMPRESSION: Negative CT scan of the abdomen and pelvis.  No cause of left-sided pain is identified.  Original Report Authenticated By: Thomasenia Sales, M.D.   Ct Abdomen Pelvis W Contrast  12/26/2011  *RADIOLOGY REPORT*  Clinical Data: Left flank and abdominal pain.  CT ABDOMEN AND PELVIS WITH CONTRAST  Technique:  Multidetector CT imaging of the abdomen and pelvis was performed following the standard protocol during bolus administration of intravenous contrast.  Contrast: OMNIPAQUE IOHEXOL 300 MG/ML  SOLN  Comparison: Radiography 11/23/2011  Findings: Lung bases are clear except for minimal dependent scarring or atelectasis.  No pleural or  pericardial fluid.  There is a small hiatal hernia.  The liver has a normal appearance without focal lesions or biliary ductal dilatation.  No calcified gallstones.  The spleen is normal. The pancreas is normal.  The adrenal glands are normal.  The kidneys are normal.  The aorta and IVC are normal.  No retroperitoneal mass or adenopathy.  No free intraperitoneal fluid or air.  The bladder appears normal.  Uterus and adnexal regions appear normal.  No bowel pathology is seen.  The appendix is normal.  No significant bony finding.  IMPRESSION: Negative CT scan of the abdomen and pelvis.  No cause of left-sided pain is identified.   Original Report Authenticated By: Thomasenia Sales, M.D.     1. Flank pain       MDM  Patient with pain to left abdomen for months. Was seen in ER month ago for the same. Patient states nothing was found, however there CT scan was done at that time. She's continued to have pain is worse after eating dairy. Lab work is reassuring. Patient states she is still occasionally using crack. CT did not show a cause for the pain. Patient be discharged home with some pain medicine and will followup with gastroenterology. She asked for refills of her blood pressure medicines. She was given a 14 day supply.     I personally performed the services described in this documentation, which was scribed in my presence. The recorded information has been reviewed and considered.     Juliet Rude. Rubin Payor, MD 12/26/11 1317

## 2011-12-28 LAB — URINE CULTURE

## 2012-04-19 ENCOUNTER — Emergency Department (HOSPITAL_COMMUNITY)
Admission: EM | Admit: 2012-04-19 | Discharge: 2012-04-19 | Disposition: A | Discharge status: Home / Self Care | Payer: Self-pay | Attending: Emergency Medicine | Admitting: Emergency Medicine

## 2012-04-19 ENCOUNTER — Encounter (HOSPITAL_COMMUNITY): Payer: Self-pay | Admitting: Emergency Medicine

## 2012-04-19 DIAGNOSIS — Z3202 Encounter for pregnancy test, result negative: Secondary | ICD-10-CM | POA: Insufficient documentation

## 2012-04-19 DIAGNOSIS — M549 Dorsalgia, unspecified: Secondary | ICD-10-CM | POA: Insufficient documentation

## 2012-04-19 DIAGNOSIS — Z87440 Personal history of urinary (tract) infections: Secondary | ICD-10-CM | POA: Insufficient documentation

## 2012-04-19 DIAGNOSIS — Z8614 Personal history of Methicillin resistant Staphylococcus aureus infection: Secondary | ICD-10-CM | POA: Insufficient documentation

## 2012-04-19 DIAGNOSIS — M109 Gout, unspecified: Secondary | ICD-10-CM | POA: Insufficient documentation

## 2012-04-19 DIAGNOSIS — G43909 Migraine, unspecified, not intractable, without status migrainosus: Secondary | ICD-10-CM | POA: Insufficient documentation

## 2012-04-19 DIAGNOSIS — Z79899 Other long term (current) drug therapy: Secondary | ICD-10-CM | POA: Insufficient documentation

## 2012-04-19 DIAGNOSIS — R112 Nausea with vomiting, unspecified: Secondary | ICD-10-CM | POA: Insufficient documentation

## 2012-04-19 LAB — CBC WITH DIFFERENTIAL/PLATELET
Eosinophils Relative: 2 % (ref 0–5)
HCT: 40.9 % (ref 36.0–46.0)
Lymphocytes Relative: 39 % (ref 12–46)
Lymphs Abs: 2 10*3/uL (ref 0.7–4.0)
MCH: 30 pg (ref 26.0–34.0)
MCV: 88.1 fL (ref 78.0–100.0)
Monocytes Absolute: 0.6 10*3/uL (ref 0.1–1.0)
RBC: 4.64 MIL/uL (ref 3.87–5.11)
WBC: 5.3 10*3/uL (ref 4.0–10.5)

## 2012-04-19 LAB — COMPREHENSIVE METABOLIC PANEL
AST: 14 U/L (ref 0–37)
BUN: 12 mg/dL (ref 6–23)
CO2: 27 mEq/L (ref 19–32)
Chloride: 101 mEq/L (ref 96–112)
Creatinine, Ser: 0.73 mg/dL (ref 0.50–1.10)
GFR calc non Af Amer: >90 mL/min (ref >90)
Glucose, Bld: 81 mg/dL (ref 70–99)
Total Bilirubin: 0.3 mg/dL (ref 0.3–1.2)

## 2012-04-19 LAB — URINALYSIS, ROUTINE W REFLEX MICROSCOPIC
Bilirubin Urine: NEGATIVE
Glucose, UA: NEGATIVE mg/dL
Hgb urine dipstick: NEGATIVE
Ketones, ur: NEGATIVE mg/dL
pH: 6.5 (ref 5.0–8.0)

## 2012-04-19 LAB — POCT PREGNANCY, URINE: Preg Test, Ur: NEGATIVE

## 2012-04-19 MED ORDER — HYDROMORPHONE HCL PF 1 MG/ML IJ SOLN
1.0000 mg | Freq: Once | INTRAMUSCULAR | Status: AC
  Administered 2012-04-19: 1 mg via INTRAMUSCULAR
  Filled 2012-04-19: qty 1

## 2012-04-19 MED ORDER — OXYCODONE-ACETAMINOPHEN 5-325 MG PO TABS: 1.0000 | ORAL_TABLET | Freq: Four times a day (QID) | ORAL | Status: AC | PRN

## 2012-04-19 MED ORDER — PROMETHAZINE HCL 25 MG/ML IJ SOLN
25.0000 mg | Freq: Once | INTRAMUSCULAR | Status: AC
  Administered 2012-04-19: 25 mg via INTRAMUSCULAR
  Filled 2012-04-19: qty 1

## 2012-04-19 MED ORDER — PROMETHAZINE HCL 25 MG PO TABS: 25.0000 mg | ORAL_TABLET | Freq: Four times a day (QID) | ORAL | Status: DC | PRN

## 2012-04-19 MED ORDER — DIPHENHYDRAMINE HCL 12.5 MG/5ML PO ELIX
12.5000 mg | ORAL_SOLUTION | Freq: Once | ORAL | Status: AC
  Administered 2012-04-19: 12.5 mg via ORAL
  Filled 2012-04-19: qty 5

## 2012-04-19 NOTE — ED Provider Notes (Signed)
History     CSN: [id number]  Arrival date & time 04/19/12  1478   First MD Initiated Contact with Patient 04/19/12 [phone]      Chief Complaint  Patient presents with  . Headache  . Emesis  . Back Pain    (Consider location/radiation/quality/duration/timing/severity/associated sxs/prior treatment) Patient is a 41 y.o. female presenting with headaches, vomiting, and back pain. The history is provided by the patient.  Headache  This is a chronic problem. The current episode started more than 2 days ago. The problem has been gradually worsening. The headache is associated with an unknown factor. The pain is located in the frontal region. The quality of the pain is described as throbbing. The pain is moderate. Radiates to: back of the neck. Associated symptoms include nausea and vomiting. Pertinent negatives include no fever, no near-syncope, no palpitations and no shortness of breath. She has tried acetaminophen for the symptoms. The treatment provided no relief.  Emesis  Associated symptoms include arthralgias and headaches. Pertinent negatives include no abdominal pain, no cough and no fever.  Back Pain  Associated symptoms include headaches. Pertinent negatives include no chest pain, no fever, no abdominal pain and no dysuria.    Past Medical History  Diagnosis Date  . Gout   . Migraine   . Kidney infection   . MRSA infection     History reviewed. No pertinent past surgical history.  History reviewed. No pertinent family history.  History  Substance Use Topics  . Smoking status: Never Smoker   . Smokeless tobacco: Not on file  . Alcohol Use: No    OB History    Grav Para Term Preterm Abortions TAB SAB Ect Mult Living                  Review of Systems  Constitutional: Negative for fever and activity change.       All ROS Neg except as noted in HPI  HENT: Negative for nosebleeds and neck pain.   Eyes: Negative for photophobia and discharge.  Respiratory: Negative  for cough, shortness of breath and wheezing.   Cardiovascular: Negative for chest pain, palpitations and near-syncope.  Gastrointestinal: Positive for nausea and vomiting. Negative for abdominal pain and blood in stool.  Genitourinary: Negative for dysuria, frequency and hematuria.  Musculoskeletal: Positive for arthralgias. Negative for back pain.  Skin: Negative.   Neurological: Positive for headaches. Negative for dizziness, seizures and speech difficulty.  Psychiatric/Behavioral: Negative for hallucinations and confusion.    Allergies  Codeine; Other; Peach; Strawberry; and Tramadol  Home Medications   Current Outpatient Rx  Name  Route  Sig  Dispense  Refill  . CITALOPRAM HYDROBROMIDE 10 MG PO TABS   Oral   Take 10 mg by mouth daily.         Marland Kitchen DIPHENHYDRAMINE HCL 25 MG PO CAPS   Oral   Take 25 mg by mouth every 6 (six) hours as needed. For allergic reaction         . LISINOPRIL 10 MG PO TABS   Oral   Take 10 mg by mouth daily.         . ADULT MULTIVITAMIN W/MINERALS CH   Oral   Take 1 tablet by mouth daily.         Marland Kitchen VITAMIN C 500 MG PO TABS   Oral   Take 500 mg by mouth daily.           BP 153/98  Pulse 86  Temp  97.7 F (36.5 C) (Oral)  Resp 20  Ht 5\' 1"  (1.549 m)  Wt 153 lb (69.4 kg)  BMI 28.91 kg/m2  SpO2 99%  LMP 04/17/2012  Physical Exam  Nursing note and vitals reviewed. Constitutional: She is oriented to person, place, and time. She appears well-developed and well-nourished.  Non-toxic appearance.  HENT:  Head: Normocephalic.  Right Ear: Tympanic membrane and external ear normal.  Left Ear: Tympanic membrane and external ear normal.  Eyes: EOM and lids are normal. Pupils are equal, round, and reactive to light.  Neck: Normal range of motion. Neck supple. Carotid bruit is not present.  Cardiovascular: Normal rate, regular rhythm, normal heart sounds, intact distal pulses and normal pulses.   Pulmonary/Chest: Breath sounds normal. No  respiratory distress.  Abdominal: Soft. Bowel sounds are normal. There is no tenderness. There is no guarding.  Musculoskeletal: Normal range of motion.       Lumbar paraspinal pain with change of position and palpation. No hot areas.  Lymphadenopathy:       Head (right side): No submandibular adenopathy present.       Head (left side): No submandibular adenopathy present.    She has no cervical adenopathy.  Neurological: She is alert and oriented to person, place, and time. She has normal strength. No cranial nerve deficit or sensory deficit. She exhibits normal muscle tone. Coordination normal.  Skin: Skin is warm and dry.  Psychiatric: She has a normal mood and affect. Her speech is normal. Cognition and memory are normal. She expresses no suicidal ideation.    ED Course  Procedures (including critical care time)   Labs Reviewed  POCT PREGNANCY, URINE  CBC WITH DIFFERENTIAL  URINALYSIS, ROUTINE W REFLEX MICROSCOPIC  COMPREHENSIVE METABOLIC PANEL   No results found.   No diagnosis found.    MDM  I have reviewed nursing notes, vital signs, and all appropriate lab and imaging results for this patient. Pt has hx of migraine headaches. This headache is similar to previous headaches. No gross neuro changes noted on exam. Pt treated with IM dilaudid and promethazine in ED. Rx for percocet and promethazine given to the patient.       Kathie Dike, PA 04/19/12 [address] [place], [place] 04/19/12 1054

## 2012-04-19 NOTE — ED Notes (Signed)
Pt c/o headache x 2 days with vomiting. Pt states she has vomited x 3-4times today. Pt also c/o lower back pain and has been recently treated for uti.

## 2012-04-19 NOTE — ED Provider Notes (Signed)
Medical screening examination/treatment/procedure(s) were performed by non-physician practitioner and as supervising physician I was immediately available for consultation/collaboration.   Joya Gaskins, MD 04/19/12 1537
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
## 2012-05-05 ENCOUNTER — Encounter (HOSPITAL_COMMUNITY): Payer: Self-pay | Admitting: Emergency Medicine

## 2012-05-05 ENCOUNTER — Emergency Department (HOSPITAL_COMMUNITY): Payer: Self-pay

## 2012-05-05 ENCOUNTER — Emergency Department (HOSPITAL_COMMUNITY)
Admission: EM | Admit: 2012-05-05 | Discharge: 2012-05-05 | Disposition: A | Discharge status: Home / Self Care | Payer: Self-pay | Attending: Emergency Medicine | Admitting: Emergency Medicine

## 2012-05-05 DIAGNOSIS — I10 Essential (primary) hypertension: Secondary | ICD-10-CM | POA: Insufficient documentation

## 2012-05-05 DIAGNOSIS — R51 Headache: Secondary | ICD-10-CM | POA: Insufficient documentation

## 2012-05-05 DIAGNOSIS — Z87448 Personal history of other diseases of urinary system: Secondary | ICD-10-CM | POA: Insufficient documentation

## 2012-05-05 DIAGNOSIS — R197 Diarrhea, unspecified: Secondary | ICD-10-CM | POA: Insufficient documentation

## 2012-05-05 DIAGNOSIS — R059 Cough, unspecified: Secondary | ICD-10-CM | POA: Insufficient documentation

## 2012-05-05 DIAGNOSIS — J9801 Acute bronchospasm: Secondary | ICD-10-CM | POA: Insufficient documentation

## 2012-05-05 DIAGNOSIS — Z79899 Other long term (current) drug therapy: Secondary | ICD-10-CM | POA: Insufficient documentation

## 2012-05-05 DIAGNOSIS — R6883 Chills (without fever): Secondary | ICD-10-CM | POA: Insufficient documentation

## 2012-05-05 DIAGNOSIS — Z8679 Personal history of other diseases of the circulatory system: Secondary | ICD-10-CM | POA: Insufficient documentation

## 2012-05-05 DIAGNOSIS — R05 Cough: Secondary | ICD-10-CM

## 2012-05-05 DIAGNOSIS — H9209 Otalgia, unspecified ear: Secondary | ICD-10-CM | POA: Insufficient documentation

## 2012-05-05 DIAGNOSIS — M109 Gout, unspecified: Secondary | ICD-10-CM | POA: Insufficient documentation

## 2012-05-05 DIAGNOSIS — R1110 Vomiting, unspecified: Secondary | ICD-10-CM | POA: Insufficient documentation

## 2012-05-05 HISTORY — DX: Essential (primary) hypertension: I10

## 2012-05-05 LAB — CBC WITH DIFFERENTIAL/PLATELET
Eosinophils Relative: 2 % (ref 0–5)
HCT: 37.1 % (ref 36.0–46.0)
Hemoglobin: 12.8 g/dL (ref 12.0–15.0)
Lymphocytes Relative: 39 % (ref 12–46)
Lymphs Abs: 2.7 10*3/uL (ref 0.7–4.0)
MCV: 87.1 fL (ref 78.0–100.0)
Monocytes Relative: 9 % (ref 3–12)
Platelets: 292 10*3/uL (ref 150–400)
RBC: 4.26 MIL/uL (ref 3.87–5.11)
WBC: 6.9 10*3/uL (ref 4.0–10.5)

## 2012-05-05 LAB — BASIC METABOLIC PANEL
BUN: 13 mg/dL (ref 6–23)
CO2: 25 mEq/L (ref 19–32)
Calcium: 8.9 mg/dL (ref 8.4–10.5)
Glucose, Bld: 91 mg/dL (ref 70–99)
Sodium: 136 mEq/L (ref 135–145)

## 2012-05-05 MED ORDER — KETOROLAC TROMETHAMINE 30 MG/ML IJ SOLN
30.0000 mg | Freq: Once | INTRAMUSCULAR | Status: DC
  Filled 2012-05-05: qty 1

## 2012-05-05 MED ORDER — IPRATROPIUM BROMIDE 0.02 % IN SOLN
0.5000 mg | Freq: Once | RESPIRATORY_TRACT | Status: AC
  Administered 2012-05-05: 0.5 mg via RESPIRATORY_TRACT
  Filled 2012-05-05: qty 2.5

## 2012-05-05 MED ORDER — METHYLPREDNISOLONE SODIUM SUCC 125 MG IJ SOLR
125.0000 mg | Freq: Once | INTRAMUSCULAR | Status: AC
  Administered 2012-05-05: 125 mg via INTRAVENOUS
  Filled 2012-05-05: qty 2

## 2012-05-05 MED ORDER — METOCLOPRAMIDE HCL 5 MG/ML IJ SOLN
10.0000 mg | Freq: Once | INTRAMUSCULAR | Status: AC
  Administered 2012-05-05: 10 mg via INTRAVENOUS
  Filled 2012-05-05: qty 2

## 2012-05-05 MED ORDER — SODIUM CHLORIDE 0.9 % IV BOLUS (SEPSIS)
1000.0000 mL | Freq: Once | INTRAVENOUS | Status: AC
  Administered 2012-05-05: 1000 mL via INTRAVENOUS

## 2012-05-05 MED ORDER — ALBUTEROL SULFATE HFA 108 (90 BASE) MCG/ACT IN AERS
2.0000 | INHALATION_SPRAY | RESPIRATORY_TRACT | Status: DC | PRN
  Administered 2012-05-05: 2 via RESPIRATORY_TRACT
  Filled 2012-05-05: qty 6.7

## 2012-05-05 MED ORDER — SODIUM CHLORIDE 0.9 % IV SOLN
Freq: Once | INTRAVENOUS | Status: AC
  Administered 2012-05-05: 18:00:00 via INTRAVENOUS

## 2012-05-05 MED ORDER — DIPHENHYDRAMINE HCL 50 MG/ML IJ SOLN
25.0000 mg | Freq: Once | INTRAMUSCULAR | Status: AC
  Administered 2012-05-05: 25 mg via INTRAVENOUS
  Filled 2012-05-05: qty 1

## 2012-05-05 MED ORDER — MORPHINE SULFATE 4 MG/ML IJ SOLN
4.0000 mg | Freq: Once | INTRAMUSCULAR | Status: AC
  Administered 2012-05-05: 4 mg via INTRAVENOUS
  Filled 2012-05-05: qty 1

## 2012-05-05 MED ORDER — PREDNISONE 20 MG PO TABS: 60.0000 mg | ORAL_TABLET | Freq: Every day | ORAL | Status: DC

## 2012-05-05 MED ORDER — ALBUTEROL SULFATE (5 MG/ML) 0.5% IN NEBU
2.5000 mg | INHALATION_SOLUTION | Freq: Once | RESPIRATORY_TRACT | Status: AC
  Administered 2012-05-05: 2.5 mg via RESPIRATORY_TRACT
  Filled 2012-05-05: qty 0.5

## 2012-05-05 NOTE — ED Provider Notes (Signed)
History   This chart was scribed for Joanna Booze, MD scribed by Magnus Sinning. The patient was seen in room APA05/APA05 at 17:33  CSN: [id number]  Arrival date & time 05/05/12  1641    Chief Complaint  Patient presents with  . Cough  . Headache    (Consider location/radiation/quality/duration/timing/severity/associated sxs/prior treatment) HPI Comments: Joanna Perry is a 41 y.o. female who presents to the Emergency Department complaining of constant moderate emesis, onset 2.5 weeks ago, with associated 5-6 episodes of diarrhea, dry cough, left-sided HA, chills, and subjective fever. The patient states that 2.5 weeks ago she began having flu-like sxs, such as fever, constant emesis, chills, and diarrhea. She reports shortly after she also began having what intially began as a productive cough. She provides that she has not seen any any blood or mucous in the stool, but notes her last episode of diarrhea was this morning.  The patient adds that she began having a left-sided headache that began last night. She denies she has any current feelings of diarrhea or emesis, and says she has not been eating well because of inability to keep food down. She also reports she took Cheratussin that she picked up from [facility].  The patient reports hx of HTN and says she does not smoke.   PCP: None   Patient is a 41 y.o. female presenting with cough, headaches, and vomiting. The history is provided by the patient. No language interpreter was used.  Cough This is a new problem. The current episode started more than 1 week ago. The problem occurs constantly. The problem has not changed since onset.The cough is non-productive. The fever has been present for 5 days or more. Associated symptoms include chills, ear pain and headaches. Pertinent negatives include no myalgias. She has tried cough syrup for the symptoms. The treatment provided no relief. She is not a smoker.  Headache  Associated symptoms  include vomiting.  Emesis  This is a chronic problem. The current episode started more than 1 week ago. The problem occurs 5 to 10 times per day. The problem has not changed since onset.The emesis has an appearance of bilious material. Associated symptoms include chills, cough, diarrhea and headaches. Pertinent negatives include no myalgias.    Past Medical History  Diagnosis Date  . Gout   . Migraine   . Kidney infection   . MRSA infection   . Hypertension     History reviewed. No pertinent past surgical history.  No family history on file.  History  Substance Use Topics  . Smoking status: Never Smoker   . Smokeless tobacco: Not on file  . Alcohol Use: No     Review of Systems  Constitutional: Positive for chills.  HENT: Positive for ear pain.   Respiratory: Positive for cough.   Gastrointestinal: Positive for vomiting and diarrhea.  Musculoskeletal: Negative for myalgias.  Neurological: Positive for headaches.  All other systems reviewed and are negative.  10 Systems reviewed and are negative for acute change except as noted in the HPI.  Allergies  Codeine; Other; Peach; Strawberry; and Tramadol  Home Medications   Current Outpatient Rx  Name  Route  Sig  Dispense  Refill  . CITALOPRAM HYDROBROMIDE 10 MG PO TABS   Oral   Take 10 mg by mouth daily.         Marland Kitchen DIPHENHYDRAMINE HCL 25 MG PO CAPS   Oral   Take 25 mg by mouth every 6 (six) hours as needed.  For allergic reaction         . LISINOPRIL 10 MG PO TABS   Oral   Take 10 mg by mouth daily.         . ADULT MULTIVITAMIN W/MINERALS CH   Oral   Take 1 tablet by mouth daily.         Marland Kitchen PROMETHAZINE HCL 25 MG PO TABS   Oral   Take 1 tablet (25 mg total) by mouth every 6 (six) hours as needed for nausea.   12 tablet   0   . VITAMIN C 500 MG PO TABS   Oral   Take 500 mg by mouth daily.           BP 144/96  Pulse 91  Temp 98 F (36.7 C) (Oral)  Resp 19  Ht 5\' 1"  (1.549 m)  Wt 157 lb  (71.215 kg)  BMI 29.67 kg/m2  SpO2 100%  LMP 05/05/2012  Physical Exam  Nursing note and vitals reviewed. Constitutional: She is oriented to person, place, and time. She appears well-developed and well-nourished. No distress.  HENT:  Head: Normocephalic and atraumatic.  Eyes: Conjunctivae normal and EOM are normal.  Neck: Neck supple. No tracheal deviation present.  Cardiovascular: Normal rate, regular rhythm and normal heart sounds.  Exam reveals no gallop and no friction rub.   No murmur heard. Pulmonary/Chest: Effort normal. No respiratory distress. She has wheezes. She has no rales. She exhibits no tenderness.       Prolonged exhalation phase and slight wheeze noted with cough  Abdominal: She exhibits no distension.  Musculoskeletal: Normal range of motion.  Neurological: She is alert and oriented to person, place, and time. No sensory deficit.  Skin: Skin is warm and dry.  Psychiatric: She has a normal mood and affect. Her behavior is normal.    ED Course  Procedures (including critical care time) DIAGNOSTIC STUDIES: Oxygen Saturation is 100% on room air, normal by my interpretation.    COORDINATION OF CARE: 17:35: Physical exam performed  Medications  lisinopril-hydrochlorothiazide (PRINZIDE,ZESTORETIC) 10-12.5 MG per tablet (not administered)  sodium chloride 0.9 % bolus 1,000 mL (0 mL Intravenous Stopped 05/05/12 1859)  metoCLOPramide (REGLAN) injection 10 mg (10 mg Intravenous Given 05/05/12 1804)  diphenhydrAMINE (BENADRYL) injection 25 mg (25 mg Intravenous Given 05/05/12 1805)  albuterol (PROVENTIL) (5 MG/ML) 0.5% nebulizer solution 2.5 mg (2.5 mg Nebulization Given 05/05/12 1813)  ipratropium (ATROVENT) nebulizer solution 0.5 mg (0.5 mg Nebulization Given 05/05/12 1813)  0.9 %  sodium chloride infusion (  Intravenous New Bag/Given 05/05/12 1801)  methylPREDNISolone sodium succinate (SOLU-MEDROL) 125 mg/2 mL injection 125 mg (125 mg Intravenous Given 05/05/12 1926)   morphine 4 MG/ML injection 4 mg (4 mg Intravenous Given 05/05/12 1939)   Results for orders placed during the hospital encounter of 05/05/12  CBC WITH DIFFERENTIAL      Component Value Range   WBC 6.9  4.0 - 10.5 K/uL   RBC 4.26  3.87 - 5.11 MIL/uL   Hemoglobin 12.8  12.0 - 15.0 g/dL   HCT 16.1  09.6 - 04.5 %   MCV 87.1  78.0 - 100.0 fL   MCH 30.0  26.0 - 34.0 pg   MCHC 34.5  30.0 - 36.0 g/dL   RDW 40.9  81.1 - 91.4 %   Platelets 292  150 - 400 K/uL   Neutrophils Relative 49  43 - 77 %   Neutro Abs 3.3  1.7 - 7.7 K/uL   Lymphocytes Relative  39  12 - 46 %   Lymphs Abs 2.7  0.7 - 4.0 K/uL   Monocytes Relative 9  3 - 12 %   Monocytes Absolute 0.6  0.1 - 1.0 K/uL   Eosinophils Relative 2  0 - 5 %   Eosinophils Absolute 0.2  0.0 - 0.7 K/uL   Basophils Relative 1  0 - 1 %   Basophils Absolute 0.0  0.0 - 0.1 K/uL  BASIC METABOLIC PANEL      Component Value Range   Sodium 136  135 - 145 mEq/L   Potassium 3.6  3.5 - 5.1 mEq/L   Chloride 104  96 - 112 mEq/L   CO2 25  19 - 32 mEq/L   Glucose, Bld 91  70 - 99 mg/dL   BUN 13  6 - 23 mg/dL   Creatinine, Ser 1.61  0.50 - 1.10 mg/dL   Calcium 8.9  8.4 - 09.6 mg/dL   GFR calc non Af Amer >90  >90 mL/min   GFR calc Af Amer >90  >90 mL/min   Dg Chest 2 View  05/05/2012  *RADIOLOGY REPORT*  Clinical Data: Cough and headache.  Congestion.  CHEST - 2 VIEW  Comparison: One-view chest 03/31/2012.  Findings: The heart size is normal.  The visualized soft tissues and bony thorax are unremarkable.  The lungs are clear.  IMPRESSION: Negative chest.   Original Report Authenticated By: Marin Roberts, M.D.        1. Cough   2. Bronchospasm   3. Headache       MDM  Cough that seemed for a bronchospastic component. He'll be given a therapeutic trial of albuterol. Headache will be treated with IV fluids and metoclopramide and diphenhydramine.  She got good relief of cough with albuterol however her headache persists. She's given a dose of  morphine with good relief of headache. She will be discharged with an albuterol inhaler and prescription for prednisone. Initial dose of methylprednisolone was given in the ED.  I personally performed the services described in this documentation, which was scribed in my presence. The recorded information has been reviewed and is accurate.           Joanna Booze, MD 05/05/12 2012

## 2012-05-05 NOTE — ED Notes (Signed)
Patient with c/o headache, cough, N/V/D x 2.5 weeks.

## 2012-05-06 ENCOUNTER — Emergency Department (HOSPITAL_COMMUNITY)
Admission: EM | Admit: 2012-05-06 | Discharge: 2012-05-06 | Disposition: A | Discharge status: Home / Self Care | Payer: Self-pay | Attending: Emergency Medicine | Admitting: Emergency Medicine

## 2012-05-06 ENCOUNTER — Encounter (HOSPITAL_COMMUNITY): Payer: Self-pay

## 2012-05-06 ENCOUNTER — Emergency Department (HOSPITAL_COMMUNITY): Payer: Self-pay

## 2012-05-06 DIAGNOSIS — Z8669 Personal history of other diseases of the nervous system and sense organs: Secondary | ICD-10-CM | POA: Insufficient documentation

## 2012-05-06 DIAGNOSIS — R51 Headache: Secondary | ICD-10-CM | POA: Insufficient documentation

## 2012-05-06 DIAGNOSIS — Z7982 Long term (current) use of aspirin: Secondary | ICD-10-CM | POA: Insufficient documentation

## 2012-05-06 DIAGNOSIS — I10 Essential (primary) hypertension: Secondary | ICD-10-CM | POA: Insufficient documentation

## 2012-05-06 DIAGNOSIS — M109 Gout, unspecified: Secondary | ICD-10-CM | POA: Insufficient documentation

## 2012-05-06 DIAGNOSIS — R079 Chest pain, unspecified: Secondary | ICD-10-CM | POA: Insufficient documentation

## 2012-05-06 DIAGNOSIS — Z87448 Personal history of other diseases of urinary system: Secondary | ICD-10-CM | POA: Insufficient documentation

## 2012-05-06 DIAGNOSIS — Z8614 Personal history of Methicillin resistant Staphylococcus aureus infection: Secondary | ICD-10-CM | POA: Insufficient documentation

## 2012-05-06 DIAGNOSIS — R197 Diarrhea, unspecified: Secondary | ICD-10-CM | POA: Insufficient documentation

## 2012-05-06 DIAGNOSIS — R61 Generalized hyperhidrosis: Secondary | ICD-10-CM | POA: Insufficient documentation

## 2012-05-06 DIAGNOSIS — Z79899 Other long term (current) drug therapy: Secondary | ICD-10-CM | POA: Insufficient documentation

## 2012-05-06 DIAGNOSIS — R05 Cough: Secondary | ICD-10-CM | POA: Insufficient documentation

## 2012-05-06 DIAGNOSIS — R509 Fever, unspecified: Secondary | ICD-10-CM | POA: Insufficient documentation

## 2012-05-06 DIAGNOSIS — R059 Cough, unspecified: Secondary | ICD-10-CM | POA: Insufficient documentation

## 2012-05-06 LAB — TROPONIN I: Troponin I: <0.3 ng/mL (ref <0.30)

## 2012-05-06 MED ORDER — DEXAMETHASONE SODIUM PHOSPHATE 4 MG/ML IJ SOLN
16.0000 mg | Freq: Once | INTRAMUSCULAR | Status: AC
  Administered 2012-05-06: 16 mg via INTRAVENOUS
  Filled 2012-05-06: qty 4

## 2012-05-06 MED ORDER — SODIUM CHLORIDE 0.9 % IV BOLUS (SEPSIS)
1000.0000 mL | Freq: Once | INTRAVENOUS | Status: AC
  Administered 2012-05-06: 1000 mL via INTRAVENOUS

## 2012-05-06 MED ORDER — BENZONATATE 100 MG PO CAPS: 100.0000 mg | ORAL_CAPSULE | Freq: Three times a day (TID) | ORAL | Status: DC | PRN

## 2012-05-06 MED ORDER — IPRATROPIUM BROMIDE 0.02 % IN SOLN
0.5000 mg | Freq: Once | RESPIRATORY_TRACT | Status: AC
  Administered 2012-05-06: 0.5 mg via RESPIRATORY_TRACT
  Filled 2012-05-06: qty 2.5

## 2012-05-06 MED ORDER — ALBUTEROL SULFATE (5 MG/ML) 0.5% IN NEBU
5.0000 mg | INHALATION_SOLUTION | Freq: Once | RESPIRATORY_TRACT | Status: AC
  Administered 2012-05-06: 5 mg via RESPIRATORY_TRACT
  Filled 2012-05-06: qty 1

## 2012-05-06 MED ORDER — DIPHENHYDRAMINE HCL 50 MG/ML IJ SOLN
25.0000 mg | Freq: Once | INTRAMUSCULAR | Status: AC
  Administered 2012-05-06: 25 mg via INTRAVENOUS
  Filled 2012-05-06: qty 1

## 2012-05-06 MED ORDER — METOCLOPRAMIDE HCL 5 MG/ML IJ SOLN
10.0000 mg | Freq: Once | INTRAMUSCULAR | Status: AC
  Administered 2012-05-06: 10 mg via INTRAVENOUS
  Filled 2012-05-06: qty 2

## 2012-05-06 MED ORDER — KETOROLAC TROMETHAMINE 30 MG/ML IJ SOLN
15.0000 mg | Freq: Once | INTRAMUSCULAR | Status: AC
Start: 2012-05-06 — End: 2012-05-06
  Administered 2012-05-06: 15 mg via INTRAVENOUS
  Filled 2012-05-06: qty 1

## 2012-05-06 NOTE — ED Notes (Signed)
Discharge instructions reviewed with pt, questions answered. Pt verbalized understanding.  

## 2012-05-06 NOTE — ED Notes (Signed)
Pt was here last night and treated for bronchospasms. Was given an RX for prednisone that she did not get filled. States she does not get paid until tomorrow. States she is having chest pain now

## 2012-05-06 NOTE — ED Provider Notes (Signed)
History  This chart was scribed for Raeford Razor, MD by Shari Heritage, ED Scribe. The patient was seen in room APA07/APA07. Patient's care was started at 1741.  CSN: [id number]  Arrival date & time 05/06/12  1658   First MD Initiated Contact with Patient 05/06/12 1741      Chief Complaint  Patient presents with  . Chest Pain    The history is provided by the patient. No language interpreter was used.    HPI Comments: Joanna Perry is a 41 y.o. female who presents to the Emergency Department complaining of intermittent, severe, chest pain that radiates to upper back pnset a few hours ago. Patient describes pain as both pressure-like and stabbing. Patient rates pain as 9/10. Patient has been admitted for chest pain before and last stress test was last year. Patient states that she has had flu-like symptoms for the past 3 weeks including a severe cough. There is associated severe left-sided headache, fever, chills, diaphoresis and diarrhea. Patient denies vomiting, rash, difficulty urinating. Patient has been taking Nyquil and Robitussin. Per medical records, patient was treated here last night (Saturday, 05/05/12) for bronchospasms and headache by Dr. Preston Fleeting. Patient was discharged with albuterol inhaler and prescription for prednisone. Patient was given an initial does of methylprednisolone in the ED prior to discharge. Patient has not yet filled her rx for prednisone. Patient has a medical history of HTN, gout and MRSA.  Patient has a past history of cocaine abuse. She does not smoke cigarettes.    Past Medical History  Diagnosis Date  . Gout   . Migraine   . Kidney infection   . MRSA infection   . Hypertension     History reviewed. No pertinent past surgical history.  No family history on file.  History  Substance Use Topics  . Smoking status: Never Smoker   . Smokeless tobacco: Not on file  . Alcohol Use: No    OB History    Grav Para Term Preterm Abortions TAB SAB Ect  Mult Living                  Review of Systems  Constitutional: Positive for fever, chills and diaphoresis.  Respiratory: Positive for cough.   Cardiovascular: Positive for chest pain.  Gastrointestinal: Positive for diarrhea.  Genitourinary: Negative for difficulty urinating.  Skin: Negative for rash.  Neurological: Positive for headaches.  All other systems reviewed and are negative.    Allergies  Codeine; Other; Peach; Strawberry; and Tramadol  Home Medications   Current Outpatient Rx  Name  Route  Sig  Dispense  Refill  . ASPIRIN EC 81 MG PO TBEC   Oral   Take 324 mg by mouth daily.         Marland Kitchen CITALOPRAM HYDROBROMIDE 10 MG PO TABS   Oral   Take 10 mg by mouth daily.         Marland Kitchen LISINOPRIL-HYDROCHLOROTHIAZIDE 10-12.5 MG PO TABS   Oral   Take 1 tablet by mouth daily.         . ADULT MULTIVITAMIN W/MINERALS CH   Oral   Take 1 tablet by mouth daily.         Marland Kitchen PROMETHAZINE HCL 25 MG PO TABS   Oral   Take 1 tablet (25 mg total) by mouth every 6 (six) hours as needed for nausea.   12 tablet   0   . VITAMIN C 500 MG PO TABS   Oral   Take 500  mg by mouth daily.         Marland Kitchen PREDNISONE 20 MG PO TABS   Oral   Take 3 tablets (60 mg total) by mouth daily.   15 tablet   0     Triage Vitals: BP 146/89  Pulse 108  Temp 97.9 F (36.6 C) (Oral)  Resp 20  Ht 5\' 1"  (1.549 m)  Wt 156 lb (70.761 kg)  BMI 29.48 kg/m2  SpO2 98%  LMP 05/05/2012  Physical Exam  Nursing note and vitals reviewed. Constitutional: She is oriented to person, place, and time. She appears well-developed and well-nourished. No distress.  HENT:  Head: Normocephalic and atraumatic.  Eyes: Conjunctivae normal are normal. Right eye exhibits no discharge. Left eye exhibits no discharge.  Neck: Neck supple.  Cardiovascular: Normal rate, regular rhythm and normal heart sounds.  Exam reveals no gallop and no friction rub.   No murmur heard. Pulmonary/Chest: Effort normal and breath sounds  normal. No respiratory distress. She has no wheezes. She has no rales.       Sounds congested. Dry sounding cough.  Abdominal: Soft. She exhibits no distension. There is tenderness (diffuse). There is no rebound and no guarding.  Musculoskeletal: She exhibits no edema and no tenderness.  Neurological: She is alert and oriented to person, place, and time.  Skin: Skin is warm and dry.  Psychiatric: She has a normal mood and affect. Her behavior is normal. Thought content normal.    ED Course  Procedures (including critical care time) DIAGNOSTIC STUDIES: Oxygen Saturation is 98% on room air, normal by my interpretation.    COORDINATION OF CARE: 5:53 PM- Patient informed of current plan for treatment and evaluation and agrees with plan at this time. Ordered chest x-ray and troponin.  6:30PM- Medication orders:  metoCLOPramide (REGLAN) injection 10 mg Once, diphenhydrAMINE (BENADRYL) injection 25 mg Once, ketorolac (TORADOL) 30 MG/ML injection 15 mg Once, dexamethasone (DECADRON) injection 16 mg Once, albuterol (PROVENTIL) (5 MG/ML) 0.5% nebulizer solution 5 mg Once, ipratropium (ATROVENT) nebulizer solution 0.5 mg Once, sodium chloride 0.9 % bolus 1,000 mL Once.   7:43 PM- Patient is drowsy, but responsive to questions. States headache and chest pain is improved after medicines.   Results for orders placed during the hospital encounter of 05/06/12  TROPONIN I      Component Value Range   Troponin I <0.30  <0.30 ng/mL   Dg Chest 2 View  05/06/2012  *RADIOLOGY REPORT*  Clinical Data: Chest pain  CHEST - 2 VIEW  Comparison: 05/05/2012  Findings: Cardiomediastinal silhouette is stable.  No acute infiltrate or pleural effusion.  No pulmonary edema.  Bony thorax is stable.  IMPRESSION: No active disease.  No significant change.   Original Report Authenticated By: Natasha Mead, M.D.    Dg Chest 2 View  05/05/2012  *RADIOLOGY REPORT*  Clinical Data: Cough and headache.  Congestion.  CHEST - 2 VIEW   Comparison: One-view chest 03/31/2012.  Findings: The heart size is normal.  The visualized soft tissues and bony thorax are unremarkable.  The lungs are clear.  IMPRESSION: Negative chest.   Original Report Authenticated By: Marin Roberts, M.D.     1. Headache   2. Chest pain   3. Cough       MDM  41 year old female with headache. Suspect primary HA. Consider emergent secondary causes such as bleed, infectious or mass but doubt. There is no history of trauma. Pt has a nonfocal neurological exam. Afebrile and neck supple. No use  of blood thinning medication. Consider ocular etiology such as acute angle closure glaucoma but doubt. Pt denies acute change in visual acuity and eye exam unremarkable. Doubt temporal arteritis given age, no temporal tenderness and temporal artery pulsations palpable. Doubt CO poisoning. No contacts with similar symptoms. Doubt venous thrombosis. Doubt carotid or vertebral arteries dissection. Symptoms improved with meds. Feel that can be safely discharged, but strict return precautions discussed. Outpt fu. Pt also with CP and cough. Seen in ED earlier for same. CP atypical for ACS. Suspect muscle strain from coughing. CXR clear.       I personally preformed the services scribed in my presence. The recorded information has been reviewed is accurate. Raeford Razor, MD.    Raeford Razor, MD 05/10/12 [phone]

## 2012-05-06 NOTE — ED Notes (Signed)
Patient transported to X-ray 

## 2012-05-06 NOTE — ED Notes (Signed)
MD at bedside. 

## 2012-07-02 ENCOUNTER — Emergency Department (HOSPITAL_COMMUNITY): Payer: Self-pay

## 2012-07-02 ENCOUNTER — Emergency Department (HOSPITAL_COMMUNITY)
Admission: EM | Admit: 2012-07-02 | Discharge: 2012-07-02 | Disposition: A | Discharge status: Home / Self Care | Payer: Self-pay | Attending: Emergency Medicine | Admitting: Emergency Medicine

## 2012-07-02 ENCOUNTER — Encounter (HOSPITAL_COMMUNITY): Payer: Self-pay | Admitting: *Deleted

## 2012-07-02 DIAGNOSIS — B9689 Other specified bacterial agents as the cause of diseases classified elsewhere: Secondary | ICD-10-CM

## 2012-07-02 DIAGNOSIS — IMO0002 Reserved for concepts with insufficient information to code with codable children: Secondary | ICD-10-CM | POA: Insufficient documentation

## 2012-07-02 DIAGNOSIS — I10 Essential (primary) hypertension: Secondary | ICD-10-CM | POA: Insufficient documentation

## 2012-07-02 DIAGNOSIS — N852 Hypertrophy of uterus: Secondary | ICD-10-CM | POA: Insufficient documentation

## 2012-07-02 DIAGNOSIS — R350 Frequency of micturition: Secondary | ICD-10-CM | POA: Insufficient documentation

## 2012-07-02 DIAGNOSIS — Z79899 Other long term (current) drug therapy: Secondary | ICD-10-CM | POA: Insufficient documentation

## 2012-07-02 DIAGNOSIS — W010XXA Fall on same level from slipping, tripping and stumbling without subsequent striking against object, initial encounter: Secondary | ICD-10-CM | POA: Insufficient documentation

## 2012-07-02 DIAGNOSIS — Y929 Unspecified place or not applicable: Secondary | ICD-10-CM | POA: Insufficient documentation

## 2012-07-02 DIAGNOSIS — M109 Gout, unspecified: Secondary | ICD-10-CM | POA: Insufficient documentation

## 2012-07-02 DIAGNOSIS — N898 Other specified noninflammatory disorders of vagina: Secondary | ICD-10-CM | POA: Insufficient documentation

## 2012-07-02 DIAGNOSIS — Z8679 Personal history of other diseases of the circulatory system: Secondary | ICD-10-CM | POA: Insufficient documentation

## 2012-07-02 DIAGNOSIS — Z8619 Personal history of other infectious and parasitic diseases: Secondary | ICD-10-CM | POA: Insufficient documentation

## 2012-07-02 DIAGNOSIS — N760 Acute vaginitis: Secondary | ICD-10-CM | POA: Insufficient documentation

## 2012-07-02 DIAGNOSIS — Y939 Activity, unspecified: Secondary | ICD-10-CM | POA: Insufficient documentation

## 2012-07-02 DIAGNOSIS — F1410 Cocaine abuse, uncomplicated: Secondary | ICD-10-CM | POA: Insufficient documentation

## 2012-07-02 DIAGNOSIS — Z87440 Personal history of urinary (tract) infections: Secondary | ICD-10-CM | POA: Insufficient documentation

## 2012-07-02 DIAGNOSIS — Z3202 Encounter for pregnancy test, result negative: Secondary | ICD-10-CM | POA: Insufficient documentation

## 2012-07-02 DIAGNOSIS — R3915 Urgency of urination: Secondary | ICD-10-CM | POA: Insufficient documentation

## 2012-07-02 DIAGNOSIS — Z7982 Long term (current) use of aspirin: Secondary | ICD-10-CM | POA: Insufficient documentation

## 2012-07-02 DIAGNOSIS — N949 Unspecified condition associated with female genital organs and menstrual cycle: Secondary | ICD-10-CM | POA: Insufficient documentation

## 2012-07-02 DIAGNOSIS — R109 Unspecified abdominal pain: Secondary | ICD-10-CM | POA: Insufficient documentation

## 2012-07-02 LAB — URINALYSIS, ROUTINE W REFLEX MICROSCOPIC
Glucose, UA: NEGATIVE mg/dL
Ketones, ur: NEGATIVE mg/dL
pH: 6 (ref 5.0–8.0)

## 2012-07-02 LAB — URINE MICROSCOPIC-ADD ON

## 2012-07-02 MED ORDER — HYDROCODONE-ACETAMINOPHEN 5-325 MG PO TABS
1.0000 | ORAL_TABLET | Freq: Once | ORAL | Status: AC
  Administered 2012-07-02: 1 via ORAL
  Filled 2012-07-02: qty 1

## 2012-07-02 MED ORDER — METRONIDAZOLE 500 MG PO TABS: 500.0000 mg | ORAL_TABLET | Freq: Two times a day (BID) | ORAL | Status: DC

## 2012-07-02 MED ORDER — HYDROCODONE-ACETAMINOPHEN 5-325 MG PO TABS: ORAL_TABLET | ORAL | Status: DC

## 2012-07-02 NOTE — ED Notes (Signed)
Pt c/o dysuria x1 week and lower back pain x1 day. Pt states she tripped and fell last night and "jarred" her back.

## 2012-07-02 NOTE — ED Notes (Signed)
Low abd pain , dysuria, no frequency, voiding small amts.  No NVD.  Fell last pm , back pain

## 2012-07-03 LAB — GC/CHLAMYDIA PROBE AMP: GC Probe RNA: NEGATIVE

## 2012-07-03 NOTE — ED Provider Notes (Signed)
History     CSN: [id number]  Arrival date & time 07/02/12  1030   First MD Initiated Contact with Patient 07/02/12 1138      Chief Complaint  Patient presents with  . Dysuria    (Consider location/radiation/quality/duration/timing/severity/associated sxs/prior treatment) HPI Comments: Patient c/o burning with urination and urinary frequency for several days.  States she had similar symptoms previously with a UTI and states that she has also had pain at her vagina.  She is also concerned that she may have an STD.  She denies vomiting, fever, vaginal bleeding or possible pregnancy.   Patient also c/o lower back pain that began on the evening prior to ed arrival, states she slipped and fell back.  Denies incontinence, numbness or weakness of the LE's or saddle anesthesia's.    Patient is a 42 y.o. female presenting with female genitourinary complaint. The history is provided by the patient.  Female GU Problem Primary symptoms include discharge and dysuria.  Primary symptoms include no pelvic pain, no genital lesions, no genital pain, no genital rash, no genital itching, no genital odor and no vaginal bleeding. There has been no fever. This is a recurrent problem. The current episode started more than 2 days ago. The problem occurs constantly. The problem has not changed since onset.The symptoms occur during urination. She is not pregnant. She has not missed her period. The patient's menstrual history has been regular. The discharge was white and thick. Associated symptoms include frequency. Pertinent negatives include no abdominal pain, no nausea and no vomiting. She has tried nothing for the symptoms. The treatment provided no relief. Sexual activity: sexually active. There is a concern regarding sexually transmitted diseases. She uses nothing for contraception. Associated medical issues include STD. Associated medical issues do not include perineal abscess, ovarian cysts, endometriosis,  gynecological surgery, ectopic pregnancy, miscarriage or terminated pregnancy.    Past Medical History  Diagnosis Date  . Gout   . Migraine   . MRSA infection   . Hypertension   . Kidney infection     History reviewed. No pertinent past surgical history.  History reviewed. No pertinent family history.  History  Substance Use Topics  . Smoking status: Never Smoker   . Smokeless tobacco: Not on file  . Alcohol Use: No    OB History    Grav Para Term Preterm Abortions TAB SAB Ect Mult Living                  Review of Systems  Constitutional: Negative for fever, chills, activity change and appetite change.  Respiratory: Negative for shortness of breath.   Cardiovascular: Negative for chest pain.  Gastrointestinal: Negative for nausea, vomiting, abdominal pain and abdominal distention.  Genitourinary: Positive for dysuria, urgency, frequency, vaginal discharge and vaginal pain. Negative for hematuria, flank pain, decreased urine volume, vaginal bleeding, difficulty urinating, menstrual problem and pelvic pain.  Musculoskeletal: Negative for back pain.  Skin: Negative for rash and wound.  Hematological: Negative for adenopathy.  All other systems reviewed and are negative.    Allergies  Codeine; Other; Peach; Strawberry; Toradol; and Tramadol  Home Medications   Current Outpatient Rx  Name  Route  Sig  Dispense  Refill  . ASPIRIN EC 81 MG PO TBEC   Oral   Take 324 mg by mouth daily.         . INDOMETHACIN 25 MG PO CAPS   Oral   Take 25 mg by mouth 2 (two) times daily with a  meal.         . LISINOPRIL-HYDROCHLOROTHIAZIDE 10-12.5 MG PO TABS   Oral   Take 1 tablet by mouth daily.         . ADULT MULTIVITAMIN W/MINERALS CH   Oral   Take 1 tablet by mouth daily.         Marland Kitchen VITAMIN C 500 MG PO TABS   Oral   Take 500 mg by mouth daily.         Marland Kitchen HYDROCODONE-ACETAMINOPHEN 5-325 MG PO TABS      Take one-two tabs po q 4-6 hrs prn pain   15 tablet    0   . METRONIDAZOLE 500 MG PO TABS   Oral   Take 1 tablet (500 mg total) by mouth 2 (two) times daily. For 10 days   20 tablet   0     BP 143/106  Pulse 77  Temp 98 F (36.7 C) (Oral)  Resp 18  Ht 5\' 1"  (1.549 m)  Wt 156 lb (70.761 kg)  BMI 29.48 kg/m2  SpO2 98%  LMP 06/25/2012  Physical Exam  Nursing note and vitals reviewed. Constitutional: She is oriented to person, place, and time. She appears well-developed and well-nourished. No distress.  HENT:  Head: Normocephalic and atraumatic.  Mouth/Throat: Oropharynx is clear and moist.  Neck: Normal range of motion. Neck supple.  Cardiovascular: Normal rate, regular rhythm, normal heart sounds and intact distal pulses.   No murmur heard. Pulmonary/Chest: Effort normal and breath sounds normal. No respiratory distress.  Abdominal: Soft. Normal appearance. She exhibits no distension and no mass. There is tenderness in the suprapubic area. There is no rigidity, no rebound, no guarding, no CVA tenderness and no tenderness at McBurney's point.  Genitourinary: There is no tenderness or lesion on the right labia. There is no tenderness or lesion on the left labia. Uterus is enlarged. Uterus is not tender. Cervix exhibits no motion tenderness, no discharge and no friability. Right adnexum displays no mass and no tenderness. Left adnexum displays no mass and no tenderness. No erythema, tenderness or bleeding around the vagina. No foreign body around the vagina. No signs of injury around the vagina. Vaginal discharge found.       Pea sized palpable mass to the cervix. No CMT, no adnexal masses.  White discharge present in the vagina.  No bleeding    Musculoskeletal: She exhibits no edema.  Neurological: She is alert and oriented to person, place, and time. She exhibits normal muscle tone. Coordination normal.  Skin: Skin is warm and dry.    ED Course  Procedures (including critical care time)  Labs Reviewed  URINALYSIS, ROUTINE W REFLEX  MICROSCOPIC - Abnormal; Notable for the following:    Hgb urine dipstick TRACE (*)     Leukocytes, UA TRACE (*)     All other components within normal limits  URINE MICROSCOPIC-ADD ON - Abnormal; Notable for the following:    Squamous Epithelial / LPF FEW (*)     Bacteria, UA FEW (*)     All other components within normal limits  WET PREP, GENITAL - Abnormal; Notable for the following:    Clue Cells Wet Prep HPF POC MANY (*)     WBC, Wet Prep HPF POC FEW (*)     All other components within normal limits  PREGNANCY, URINE  GC/CHLAMYDIA PROBE AMP   Dg Lumbar Spine Complete  07/02/2012  *RADIOLOGY REPORT*  Clinical Data: Dysuria, back pain after fall.  LUMBAR  SPINE - COMPLETE 4+ VIEW  Comparison: None.  Findings: No fracture or spondylolisthesis is noted.  No significant degenerative changes are noted.  Disc spaces are well maintained.  IMPRESSION: Normal lumbar spine.   Original Report Authenticated By: Lupita Raider.,  M.D.      1. Bacterial vaginosis    GC and chlamydia cultures pending.     MDM    Patient has palpable mass to the cervix on exam, uterus feels enlarged,   Pt aware. pregnancy neg. States that she is waiting on approval for surgery to have a hysterectomy.    Pt is well appearing, ambulates with a steady gait.  Vitals stable   Will treat for BV and advised pt that she will be notified of the culture results and additional abx may be needed.  She verbalized understanding and agreed to care plan   Jaishawn Witzke L. Dianne Bady, [place] 07/04/12 2100

## 2012-07-04 NOTE — ED Provider Notes (Signed)
Medical screening examination/treatment/procedure(s) were performed by non-physician practitioner and as supervising physician I was immediately available for consultation/collaboration.   Benny Lennert, MD 07/04/12 2122

## 2012-12-10 ENCOUNTER — Other Ambulatory Visit (HOSPITAL_COMMUNITY): Payer: Self-pay | Admitting: Nurse Practitioner

## 2012-12-10 DIAGNOSIS — R102 Pelvic and perineal pain: Secondary | ICD-10-CM

## 2012-12-11 ENCOUNTER — Emergency Department (HOSPITAL_COMMUNITY)
Admission: EM | Admit: 2012-12-11 | Discharge: 2012-12-11 | Disposition: A | Discharge status: Home / Self Care | Payer: Self-pay | Attending: Emergency Medicine | Admitting: Emergency Medicine

## 2012-12-11 ENCOUNTER — Emergency Department (HOSPITAL_COMMUNITY): Payer: Self-pay

## 2012-12-11 ENCOUNTER — Encounter (HOSPITAL_COMMUNITY): Payer: Self-pay

## 2012-12-11 DIAGNOSIS — W108XXA Fall (on) (from) other stairs and steps, initial encounter: Secondary | ICD-10-CM | POA: Insufficient documentation

## 2012-12-11 DIAGNOSIS — T07XXXA Unspecified multiple injuries, initial encounter: Secondary | ICD-10-CM

## 2012-12-11 DIAGNOSIS — Y929 Unspecified place or not applicable: Secondary | ICD-10-CM | POA: Insufficient documentation

## 2012-12-11 DIAGNOSIS — S3981XA Other specified injuries of abdomen, initial encounter: Secondary | ICD-10-CM | POA: Insufficient documentation

## 2012-12-11 DIAGNOSIS — S93609A Unspecified sprain of unspecified foot, initial encounter: Secondary | ICD-10-CM | POA: Insufficient documentation

## 2012-12-11 DIAGNOSIS — S161XXA Strain of muscle, fascia and tendon at neck level, initial encounter: Secondary | ICD-10-CM

## 2012-12-11 DIAGNOSIS — IMO0002 Reserved for concepts with insufficient information to code with codable children: Secondary | ICD-10-CM | POA: Insufficient documentation

## 2012-12-11 DIAGNOSIS — S060X9A Concussion with loss of consciousness of unspecified duration, initial encounter: Secondary | ICD-10-CM

## 2012-12-11 DIAGNOSIS — G43909 Migraine, unspecified, not intractable, without status migrainosus: Secondary | ICD-10-CM | POA: Insufficient documentation

## 2012-12-11 DIAGNOSIS — S63509A Unspecified sprain of unspecified wrist, initial encounter: Secondary | ICD-10-CM | POA: Insufficient documentation

## 2012-12-11 DIAGNOSIS — Z8639 Personal history of other endocrine, nutritional and metabolic disease: Secondary | ICD-10-CM | POA: Insufficient documentation

## 2012-12-11 DIAGNOSIS — S93602A Unspecified sprain of left foot, initial encounter: Secondary | ICD-10-CM

## 2012-12-11 DIAGNOSIS — S139XXA Sprain of joints and ligaments of unspecified parts of neck, initial encounter: Secondary | ICD-10-CM | POA: Insufficient documentation

## 2012-12-11 DIAGNOSIS — Z7982 Long term (current) use of aspirin: Secondary | ICD-10-CM | POA: Insufficient documentation

## 2012-12-11 DIAGNOSIS — I10 Essential (primary) hypertension: Secondary | ICD-10-CM | POA: Insufficient documentation

## 2012-12-11 DIAGNOSIS — Z8614 Personal history of Methicillin resistant Staphylococcus aureus infection: Secondary | ICD-10-CM | POA: Insufficient documentation

## 2012-12-11 DIAGNOSIS — S0003XA Contusion of scalp, initial encounter: Secondary | ICD-10-CM

## 2012-12-11 DIAGNOSIS — Z87448 Personal history of other diseases of urinary system: Secondary | ICD-10-CM | POA: Insufficient documentation

## 2012-12-11 DIAGNOSIS — H539 Unspecified visual disturbance: Secondary | ICD-10-CM | POA: Insufficient documentation

## 2012-12-11 DIAGNOSIS — R55 Syncope and collapse: Secondary | ICD-10-CM | POA: Insufficient documentation

## 2012-12-11 DIAGNOSIS — S39012A Strain of muscle, fascia and tendon of lower back, initial encounter: Secondary | ICD-10-CM

## 2012-12-11 DIAGNOSIS — R112 Nausea with vomiting, unspecified: Secondary | ICD-10-CM | POA: Insufficient documentation

## 2012-12-11 DIAGNOSIS — Z79899 Other long term (current) drug therapy: Secondary | ICD-10-CM | POA: Insufficient documentation

## 2012-12-11 DIAGNOSIS — Y9389 Activity, other specified: Secondary | ICD-10-CM | POA: Insufficient documentation

## 2012-12-11 DIAGNOSIS — S63501A Unspecified sprain of right wrist, initial encounter: Secondary | ICD-10-CM

## 2012-12-11 DIAGNOSIS — Z862 Personal history of diseases of the blood and blood-forming organs and certain disorders involving the immune mechanism: Secondary | ICD-10-CM | POA: Insufficient documentation

## 2012-12-11 LAB — CBC WITH DIFFERENTIAL/PLATELET
Basophils Relative: 0 % (ref 0–1)
HCT: 39.5 % (ref 36.0–46.0)
Hemoglobin: 13.3 g/dL (ref 12.0–15.0)
Lymphs Abs: 1.7 10*3/uL (ref 0.7–4.0)
MCH: 29.2 pg (ref 26.0–34.0)
MCHC: 33.7 g/dL (ref 30.0–36.0)
Monocytes Absolute: 0.6 10*3/uL (ref 0.1–1.0)
Monocytes Relative: 7 % (ref 3–12)
Neutro Abs: 7.2 10*3/uL (ref 1.7–7.7)
RBC: 4.55 MIL/uL (ref 3.87–5.11)

## 2012-12-11 LAB — BASIC METABOLIC PANEL
BUN: 11 mg/dL (ref 6–23)
Chloride: 102 mEq/L (ref 96–112)
GFR calc Af Amer: >90 mL/min (ref >90)
Glucose, Bld: 87 mg/dL (ref 70–99)
Potassium: 3.4 mEq/L — ABNORMAL LOW (ref 3.5–5.1)
Sodium: 136 mEq/L (ref 135–145)

## 2012-12-11 LAB — GLUCOSE, CAPILLARY: Glucose-Capillary: 79 mg/dL (ref 70–99)

## 2012-12-11 MED ORDER — ONDANSETRON HCL 4 MG/2ML IJ SOLN
4.0000 mg | Freq: Once | INTRAMUSCULAR | Status: AC
  Administered 2012-12-11: 4 mg via INTRAVENOUS

## 2012-12-11 MED ORDER — ONDANSETRON HCL 4 MG/2ML IJ SOLN
4.0000 mg | Freq: Once | INTRAMUSCULAR | Status: AC
  Administered 2012-12-11: 4 mg via INTRAVENOUS
  Filled 2012-12-11: qty 2

## 2012-12-11 MED ORDER — TETANUS-DIPHTH-ACELL PERTUSSIS 5-2.5-18.5 LF-MCG/0.5 IM SUSP: 0.5000 mL | Freq: Once | INTRAMUSCULAR | Status: DC

## 2012-12-11 MED ORDER — ONDANSETRON HCL 4 MG/2ML IJ SOLN
INTRAMUSCULAR | Status: AC
  Administered 2012-12-11: 4 mg via INTRAVENOUS
  Filled 2012-12-11: qty 2

## 2012-12-11 MED ORDER — MORPHINE SULFATE 4 MG/ML IJ SOLN
4.0000 mg | Freq: Once | INTRAMUSCULAR | Status: AC
  Administered 2012-12-11: 4 mg via INTRAVENOUS
  Filled 2012-12-11: qty 1

## 2012-12-11 MED ORDER — SODIUM CHLORIDE 0.9 % IV BOLUS (SEPSIS)
1000.0000 mL | Freq: Once | INTRAVENOUS | Status: AC
  Administered 2012-12-11: 1000 mL via INTRAVENOUS

## 2012-12-11 NOTE — ED Provider Notes (Signed)
History  This chart was scribed for Joanna Gaskins, MD by Bennett Scrape, ED Scribe. This patient was seen in room APA02/APA02 and the patient's care was started at 10:29 AM.  CSN: [id number] Arrival date & time 12/11/12  1015  First MD Initiated Contact with Patient 12/11/12 1029     Chief Complaint  Patient presents with  . Fall  . Head Injury    Patient is a 42 y.o. female presenting with fall. The history is provided by the patient. No language interpreter was used.  Fall This is a new problem. The current episode started 6 to 12 hours ago. Episode frequency: once. The problem has been resolved. Associated symptoms include abdominal pain (ongoing, no changes) and headaches. Pertinent negatives include no chest pain and no shortness of breath. The symptoms are aggravated by walking (associated pains). Nothing relieves the symptoms. She has tried nothing for the symptoms.    HPI Comments: Joanna Perry is a 42 y.o. female who presents to the Emergency Department complaining of a fall off of her deck last night around 12a. Pt reports that she was outside by herself after having 2 alcoholic drinks when she felt dizzy, lost track of where she was standing and fell down 8 steps onto a railing. She admits to LOC for an unknown duration. She reports associated neck pain, HA, back pain, emesis and blurred vision currently. She admits that she was able to get up and ambulate to bed after the fall but had her boyfriend bring her for evaluation this morning. She has a h/o chronic abdominal pain that was worked up with a negative CT scan recently and denies any changes. She denies CP, SOB and hematochezia as associated symptoms. LNMP was 3 weeks ago.  Past Medical History  Diagnosis Date  . Gout   . Migraine   . MRSA infection   . Hypertension   . Kidney infection    Past Surgical History  Procedure Laterality Date  . Left leg      MRSA   No family history on file. History   Substance Use Topics  . Smoking status: Never Smoker   . Smokeless tobacco: Not on file  . Alcohol Use: Yes     Comment: last night   No OB history provided.   Review of Systems  HENT: Positive for neck pain. Negative for trouble swallowing.   Eyes: Positive for visual disturbance.  Respiratory: Negative for shortness of breath.   Cardiovascular: Negative for chest pain.  Gastrointestinal: Positive for nausea, vomiting and abdominal pain (ongoing, no changes). Negative for diarrhea and blood in stool.  Musculoskeletal: Positive for back pain and arthralgias.  Skin: Positive for wound.  Neurological: Positive for syncope and headaches. Negative for weakness and numbness.  All other systems reviewed and are negative.    Allergies  Codeine; Other; Peach; Strawberry; Toradol; and Tramadol  Home Medications   Current Outpatient Rx  Name  Route  Sig  Dispense  Refill  . aspirin EC 81 MG tablet   Oral   Take 324 mg by mouth daily.         Marland Kitchen HYDROcodone-acetaminophen (NORCO/VICODIN) 5-325 MG per tablet      Take one-two tabs po q 4-6 hrs prn pain   15 tablet   0   . indomethacin (INDOCIN) 25 MG capsule   Oral   Take 25 mg by mouth 2 (two) times daily with a meal.         .  lisinopril-hydrochlorothiazide (PRINZIDE,ZESTORETIC) 10-12.5 MG per tablet   Oral   Take 1 tablet by mouth daily.         . metroNIDAZOLE (FLAGYL) 500 MG tablet   Oral   Take 1 tablet (500 mg total) by mouth 2 (two) times daily. For 10 days   20 tablet   0   . Multiple Vitamin (MULTIVITAMIN WITH MINERALS) TABS   Oral   Take 1 tablet by mouth daily.         . vitamin C (ASCORBIC ACID) 500 MG tablet   Oral   Take 500 mg by mouth daily.          Triage Vitals: BP 140/98  Pulse 93  Temp(Src) 98.6 F (37 C) (Oral)  Resp 18  Ht 5\' 1"  (1.549 m)  Wt 176 lb (79.833 kg)  BMI 33.27 kg/m2  SpO2 98%  LMP 11/20/2012  Physical Exam  Nursing note and vitals reviewed.  CONSTITUTIONAL:  Well developed/well nourished HEAD: Normocephalic, hematoma to forehead EYES: EOMI/PERRL ENMT: Mucous membranes moist, no malocclusion, no tongue injury, no septal hematoma, no dental injury   SPINE: diffuse C, T and L tenderness, Patient maintained in spinal precautions/logroll utilized, No bruising/crepitance/stepoffs noted to spine CV: S1/S2 noted, no murmurs/rubs/gallops noted LUNGS: Lungs are clear to auscultation bilaterally, no apparent distress ABDOMEN: soft, nontender, no rebound or guarding, no bruising noted GU:no cva tenderness NEURO: Pt is awake/alert, moves all extremitiesx4, GCS 15 on my evaluation EXTREMITIES: pulses normal, full ROM, left forearm tenderness but no deformity, right wrist tenderness but no deformity, abrasion to left foot, scattered abrasions but no lacerations, All other extremities/joints palpated/ranged and nontender SKIN: warm, color normal PSYCH:flat affect  ED Course  Procedures   DIAGNOSTIC STUDIES: Oxygen Saturation is 98% on room air, normal by my interpretation.    COORDINATION OF CARE: 10:50 AM-Pt placed in c-collar. Discussed treatment plan which includes CT of head and c-spine, CXR, xrays of right wrist, left forearm, T-spine and L-spine with pt at bedside and pt agreed to plan.    Pt observed for several hrs Her nausea resolved and she was ambulatory She reported left foot pain, foot xray negative and cleansed by nurse, appears superficial in nature She does have scalp hematoma but I suspect this will resolve spontaneously she is not anticoagulated We discussed natural course of concussions Also still has right snuffbox tenderness despite negative xray, she was placed in thumb spica and referred to ortho No signs of occult chest/abdominal/spinal trauma.  Stable for d/c Pt discharged home with significant other  MDM  Nursing notes including past medical history and social history reviewed and considered in documentation xrays reviewed  and considered Labs/vital reviewed and considered      Date: 12/11/2012 1102am  Rate: 80  Rhythm: normal sinus rhythm  QRS Axis: normal  Intervals: normal  ST/T Wave abnormalities: normal  Conduction Disutrbances:none  Narrative Interpretation:   Old EKG Reviewed: unchanged from 04/2012     .I personally performed the services described in this documentation, which was scribed in my presence. The recorded information has been reviewed and is accurate.       Joanna Gaskins, MD 12/11/12 [phone]

## 2012-12-11 NOTE — ED Notes (Signed)
Pt give verbal order to give pt something to eat/drink. NT gave pt crackers,peanut butter and soda. Pt tolerating well. nad noted.

## 2012-12-11 NOTE — ED Notes (Signed)
Pt also has bruising to r side of face, abrasion to nose, and scratch to chin.  Pt oriented but falls asleep during triage.

## 2012-12-11 NOTE — ED Notes (Signed)
Given verbal order to remove pt c-collar by EDP. c-collar removed. Pt tolerated well.

## 2012-12-11 NOTE — ED Notes (Addendum)
Xray called and informed RN that pt is vomitting in xray. EDP aware and given verbal order to give pt zofran while over in xray. IV started in xray and pt given 4mg  of zofran. Pt tolerated well.

## 2012-12-11 NOTE — ED Notes (Signed)
Pt ability to stand and bear weight re-assessed. Pt able to stand and sit on side of bed with steady gait. EDP aware.

## 2012-12-11 NOTE — ED Notes (Addendum)
Per EDP verbal order, attempted to stand pt. Pt set up on side of bed and reported "nausea and dizziness."Pt unsteady upon sitting on side of bed. Pt assisted to standing position. Pt very unsteady. Pt sat back down and became nauseous and began vomitting x2. EDP aware and stated would place orders for pt.

## 2012-12-11 NOTE — ED Notes (Signed)
Pt left foot abrasion cleaned with normal saline. Pt reports "it feels like something is in my foot. " no foreign object noted upon assessment. Edp aware and reported would xray left foot.

## 2012-12-11 NOTE — ED Notes (Signed)
Pt reports was on her deck last night around midnight.  Says coulnd't sleep and was feeling dizzy.  Pt says fell off of her deck, onto a railing, and down 8 steps.  C/O pain to head and back.  Pt says neck feels sore.  Pt has swelling to forehead, bruising to arms.  Pt drowsy in triage.  Reports has had nausea and vomiting.

## 2012-12-12 ENCOUNTER — Encounter (HOSPITAL_COMMUNITY): Payer: Self-pay | Admitting: *Deleted

## 2012-12-12 ENCOUNTER — Emergency Department (HOSPITAL_COMMUNITY): Payer: Self-pay

## 2012-12-12 ENCOUNTER — Emergency Department (HOSPITAL_COMMUNITY)
Admission: EM | Admit: 2012-12-12 | Discharge: 2012-12-12 | Disposition: A | Discharge status: Home / Self Care | Payer: Self-pay | Attending: Emergency Medicine | Admitting: Emergency Medicine

## 2012-12-12 DIAGNOSIS — Z79899 Other long term (current) drug therapy: Secondary | ICD-10-CM | POA: Insufficient documentation

## 2012-12-12 DIAGNOSIS — Y9389 Activity, other specified: Secondary | ICD-10-CM | POA: Insufficient documentation

## 2012-12-12 DIAGNOSIS — Z862 Personal history of diseases of the blood and blood-forming organs and certain disorders involving the immune mechanism: Secondary | ICD-10-CM | POA: Insufficient documentation

## 2012-12-12 DIAGNOSIS — S060X9D Concussion with loss of consciousness of unspecified duration, subsequent encounter: Secondary | ICD-10-CM

## 2012-12-12 DIAGNOSIS — R4182 Altered mental status, unspecified: Secondary | ICD-10-CM | POA: Insufficient documentation

## 2012-12-12 DIAGNOSIS — Z8614 Personal history of Methicillin resistant Staphylococcus aureus infection: Secondary | ICD-10-CM | POA: Insufficient documentation

## 2012-12-12 DIAGNOSIS — IMO0002 Reserved for concepts with insufficient information to code with codable children: Secondary | ICD-10-CM | POA: Insufficient documentation

## 2012-12-12 DIAGNOSIS — S2020XA Contusion of thorax, unspecified, initial encounter: Secondary | ICD-10-CM | POA: Insufficient documentation

## 2012-12-12 DIAGNOSIS — T07XXXA Unspecified multiple injuries, initial encounter: Secondary | ICD-10-CM

## 2012-12-12 DIAGNOSIS — S0003XA Contusion of scalp, initial encounter: Secondary | ICD-10-CM | POA: Insufficient documentation

## 2012-12-12 DIAGNOSIS — S0083XD Contusion of other part of head, subsequent encounter: Secondary | ICD-10-CM

## 2012-12-12 DIAGNOSIS — Y929 Unspecified place or not applicable: Secondary | ICD-10-CM | POA: Insufficient documentation

## 2012-12-12 DIAGNOSIS — Z8679 Personal history of other diseases of the circulatory system: Secondary | ICD-10-CM | POA: Insufficient documentation

## 2012-12-12 DIAGNOSIS — Z87448 Personal history of other diseases of urinary system: Secondary | ICD-10-CM | POA: Insufficient documentation

## 2012-12-12 DIAGNOSIS — W1789XA Other fall from one level to another, initial encounter: Secondary | ICD-10-CM | POA: Insufficient documentation

## 2012-12-12 DIAGNOSIS — Z8639 Personal history of other endocrine, nutritional and metabolic disease: Secondary | ICD-10-CM | POA: Insufficient documentation

## 2012-12-12 DIAGNOSIS — S2020XD Contusion of thorax, unspecified, subsequent encounter: Secondary | ICD-10-CM

## 2012-12-12 DIAGNOSIS — I10 Essential (primary) hypertension: Secondary | ICD-10-CM | POA: Insufficient documentation

## 2012-12-12 DIAGNOSIS — S060X9A Concussion with loss of consciousness of unspecified duration, initial encounter: Secondary | ICD-10-CM | POA: Insufficient documentation

## 2012-12-12 DIAGNOSIS — H538 Other visual disturbances: Secondary | ICD-10-CM | POA: Insufficient documentation

## 2012-12-12 MED ORDER — HYDROCODONE-ACETAMINOPHEN 5-325 MG PO TABS: ORAL_TABLET | ORAL | Status: DC

## 2012-12-12 MED ORDER — DIPHENHYDRAMINE HCL 50 MG/ML IJ SOLN
25.0000 mg | Freq: Once | INTRAMUSCULAR | Status: AC
  Administered 2012-12-12: 25 mg via INTRAVENOUS
  Filled 2012-12-12: qty 1

## 2012-12-12 MED ORDER — ONDANSETRON HCL 4 MG/2ML IJ SOLN
4.0000 mg | Freq: Once | INTRAMUSCULAR | Status: AC
  Administered 2012-12-12: 4 mg via INTRAVENOUS
  Filled 2012-12-12: qty 2

## 2012-12-12 MED ORDER — METOCLOPRAMIDE HCL 5 MG/ML IJ SOLN
10.0000 mg | Freq: Once | INTRAMUSCULAR | Status: AC
  Administered 2012-12-12: 10 mg via INTRAVENOUS
  Filled 2012-12-12: qty 2

## 2012-12-12 MED ORDER — SODIUM CHLORIDE 0.9 % IV SOLN
Freq: Once | INTRAVENOUS | Status: AC
  Administered 2012-12-12: 17:00:00 via INTRAVENOUS

## 2012-12-12 MED ORDER — ONDANSETRON HCL 4 MG PO TABS: 4.0000 mg | ORAL_TABLET | Freq: Three times a day (TID) | ORAL | Status: DC | PRN

## 2012-12-12 NOTE — ED Provider Notes (Signed)
History  This chart was scribed for Ward Givens, MD by Bennett Scrape, ED Scribe. This patient was seen in room APA16A/APA16A and the patient's care was started at 3:35 PM.  CSN: [id number] Arrival date & time 12/12/12  1500  First MD Initiated Contact with Patient 12/12/12 1511     Chief Complaint  Patient presents with  . Emesis   Level 5 Caveat- AMS  Patient is a 42 y.o. female presenting with altered mental status. The history is provided by the patient and a relative (daughter). No language interpreter was used.  Altered Mental Status Severity:  Moderate Most recent episode:  Today Episode history:  Continuous Duration:  3 hours Timing:  Constant Progression:  Unchanged Chronicity:  New Context: head injury (yesterday)   Recent head injury:  Within the last 24 hours   HPI Comments: LEVERN KALKA is a 42 y.o. female who presents to the Emergency Department for AMS described as confusion with worsening of swelling and bruising from a fall yesterday. Pt was seen in the ED yesterday for a fall off of her deck with a positive LOC of unknown duration.She states she had had two mixed drinks and she doesn't normally drink. They are doing construction on the deck and two steps are gone. She was discharged with the diagnosis of a concussion after an extensive work up. Daughter reports that she stayed the night with the pt and left this morning to put in job applications around 11 AM after the pt ate breakfast. Upon returning around 1 PM, the pt did not remember that she had stayed the night. She has been lethargic since the daughter returned around 1 PM. Daughter also states that the swelling and confusion have worsened since she was hit in the right eye with a can that fell off and shelf and hit her in the same are while rearranging canned foods PTA. She states that the pt is only taking ibuprofen for pain. Pt states that she remembers leaving the hospital to go home. Daughter reports  that she helped the pt bathe last night and pt reports that she remembers this and remembers eating breakfast.she admits to having 2 to 3 episodes of emesis today, blurred vision in the right eye and HA as associated symptoms. She states that the HA is a diffuse pounding worse behind the right eye and is the same as the HA she had in the ED yesterday. States she has a hx of migraines but cannot tell how this is different from her prior migraines.  Daughter report that the pt has been staggering around since yesterday which is different from her baseline walk. When asked pt states that today is Friday (it is Wednesday), the month is  "my birthday" (her birthday was [birth year]) and she new the year. Pt is an occasional alcohol user but denies smoking.   PCP none  Past Medical History  Diagnosis Date  . Gout   . Migraine   . MRSA infection   . Hypertension   . Kidney infection    Past Surgical History  Procedure Laterality Date  . Left leg      MRSA   History reviewed. No pertinent family history. History  Substance Use Topics  . Smoking status: Never Smoker   . Smokeless tobacco: Not on file  . Alcohol Use: Yes     Comment: last night  lives with boyfriend Unemployed, not on disability   No OB history provided.   Review of Systems  Unable to perform ROS: Mental status change  Psychiatric/Behavioral: Positive for altered mental status.    Allergies  Codeine; Other; Peach; Strawberry; Toradol; and Tramadol  Home Medications   Current Outpatient Rx  Name  Route  Sig  Dispense  Refill  . cephALEXin (KEFLEX) 500 MG capsule   Oral   Take 500 mg by mouth 4 (four) times daily.         Marland Kitchen ibuprofen (ADVIL,MOTRIN) 600 MG tablet   Oral   Take 600 mg by mouth every 6 (six) hours as needed for pain.         . indomethacin (INDOCIN) 25 MG capsule   Oral   Take 25 mg by mouth 2 (two) times daily with a meal.         . lisinopril-hydrochlorothiazide (PRINZIDE,ZESTORETIC) 10-12.5 MG  per tablet   Oral   Take 1 tablet by mouth daily.          Triage Vitals: BP 136/89  Pulse 79  Temp(Src) 98.8 F (37.1 C) (Oral)  Resp 17  Ht 5' (1.524 m)  Wt 176 lb (79.833 kg)  BMI 34.37 kg/m2  SpO2 100%  LMP 11/20/2012  Vital signs normal    Physical Exam  Nursing note and vitals reviewed. Constitutional: She is oriented to person, place, and time. She appears well-developed and well-nourished.  Non-toxic appearance. She does not appear ill. No distress.  HENT:  Head: Normocephalic and atraumatic.    Right Ear: External ear normal.  Left Ear: External ear normal.  Nose: Nose normal. No mucosal edema or rhinorrhea.  Mouth/Throat: Oropharynx is clear and moist and mucous membranes are normal. No dental abscesses or edematous.    Ecchymosis of inner upper lip midline, diffuse swelling of forehead, ecchymosis of upper right eye and around right temple, swelling of the right parietal scalp  Eyes: Conjunctivae and EOM are normal. Pupils are equal, round, and reactive to light.  Neck: Normal range of motion and full passive range of motion without pain. Neck supple.  Cardiovascular: Normal rate, regular rhythm and normal heart sounds.  Exam reveals no gallop and no friction rub.   No murmur heard. Pulmonary/Chest: Effort normal and breath sounds normal. No respiratory distress. She has no wheezes. She has no rhonchi. She has no rales. She exhibits no crepitus.    ecchymosis to right medial breast and left superior breast  Abdominal: Soft. Normal appearance and bowel sounds are normal. She exhibits no distension. There is no tenderness. There is no rebound and no guarding.  Musculoskeletal: Normal range of motion. She exhibits no edema and no tenderness.       Back:  Moves all extremities well.   Neurological: She is alert and oriented to person, place, and time. She has normal strength. No cranial nerve deficit.  Skin: Skin is warm, dry and intact. No rash noted. No  erythema. No pallor.  Superficial abrasions of midline upper back and ecchymosis to right medial back, abrasion of lateral left foot  Psychiatric: She has a normal mood and affect. Her speech is normal and behavior is normal. Her mood appears not anxious.    ED Course  Procedures (including critical care time)  Medications  0.9 %  sodium chloride infusion ( Intravenous New Bag/Given 12/12/12 1640)  ondansetron (ZOFRAN) injection 4 mg (4 mg Intravenous Given 12/12/12 1640)  metoCLOPramide (REGLAN) injection 10 mg (10 mg Intravenous Given 12/12/12 1640)  diphenhydrAMINE (BENADRYL) injection 25 mg (25 mg Intravenous Given 12/12/12 1640)  DIAGNOSTIC STUDIES: Oxygen Saturation is 100% on room air, normal by my interpretation.    COORDINATION OF CARE: 3:45 PM-Discussed treatment plan which includes medications, CT of face and head and CXR with pt and daughter at bedside and both agreed to plan.   18:00 Pt has almost finished her liter of NS, states her headache is better, has a lot of questions about why she is hurting and why she is swollen.   18:50 daughter back, given results of tests  1. Concussion, with loss of consciousness of unspecified duration, subsequent encounter   2. Contusion of face, subsequent encounter   3. Abrasions of multiple sites   4. Multiple contusions of trunk, subsequent encounter   5. Headache     New Prescriptions   HYDROCODONE-ACETAMINOPHEN (NORCO/VICODIN) 5-325 MG PER TABLET    Take 1 or 2 po Q 6hrs for pain   ONDANSETRON (ZOFRAN) 4 MG TABLET    Take 1 tablet (4 mg total) by mouth every 8 (eight) hours as needed for nausea.    Plan discharge   Devoria Albe, MD, FACEP    MDM  patient returnsover 36 hours after concussion with loss of consciousness with symptoms of worsening neurological symptoms, repeat CT does not show any new acute injury. Patient is suffering from postconcussive symptoms. She was given instructions on using ice for her bruising and  swelling, she was given some pain medication to take and she is to followup with neurology.   I personally performed the services described in this documentation, which was scribed in my presence. The recorded information has been reviewed and considered.  Devoria Albe, MD, [facility]    Ward Givens, MD 12/12/12 1900

## 2012-12-12 NOTE — ED Notes (Addendum)
Fell yesterday morning , seen here and dx with concussion.  Returned due to vomiting,  Headache.Swelling of forehead, seems drowsy. Has a splint on her rt hand

## 2012-12-13 ENCOUNTER — Ambulatory Visit (HOSPITAL_COMMUNITY): Admission: RE | Admit: 2012-12-13 | Payer: Self-pay | Source: Ambulatory Visit

## 2012-12-13 ENCOUNTER — Ambulatory Visit (HOSPITAL_COMMUNITY): Payer: Self-pay

## 2012-12-17 ENCOUNTER — Encounter (HOSPITAL_COMMUNITY): Payer: Self-pay

## 2012-12-17 ENCOUNTER — Emergency Department (HOSPITAL_COMMUNITY)
Admission: EM | Admit: 2012-12-17 | Discharge: 2012-12-17 | Disposition: A | Discharge status: Home / Self Care | Payer: Self-pay | Attending: Emergency Medicine | Admitting: Emergency Medicine

## 2012-12-17 DIAGNOSIS — Z8639 Personal history of other endocrine, nutritional and metabolic disease: Secondary | ICD-10-CM | POA: Insufficient documentation

## 2012-12-17 DIAGNOSIS — M79609 Pain in unspecified limb: Secondary | ICD-10-CM | POA: Insufficient documentation

## 2012-12-17 DIAGNOSIS — Z79899 Other long term (current) drug therapy: Secondary | ICD-10-CM | POA: Insufficient documentation

## 2012-12-17 DIAGNOSIS — Z87448 Personal history of other diseases of urinary system: Secondary | ICD-10-CM | POA: Insufficient documentation

## 2012-12-17 DIAGNOSIS — G4700 Insomnia, unspecified: Secondary | ICD-10-CM | POA: Insufficient documentation

## 2012-12-17 DIAGNOSIS — Z8679 Personal history of other diseases of the circulatory system: Secondary | ICD-10-CM | POA: Insufficient documentation

## 2012-12-17 DIAGNOSIS — R220 Localized swelling, mass and lump, head: Secondary | ICD-10-CM | POA: Insufficient documentation

## 2012-12-17 DIAGNOSIS — Z8614 Personal history of Methicillin resistant Staphylococcus aureus infection: Secondary | ICD-10-CM | POA: Insufficient documentation

## 2012-12-17 DIAGNOSIS — I10 Essential (primary) hypertension: Secondary | ICD-10-CM | POA: Insufficient documentation

## 2012-12-17 DIAGNOSIS — Z862 Personal history of diseases of the blood and blood-forming organs and certain disorders involving the immune mechanism: Secondary | ICD-10-CM | POA: Insufficient documentation

## 2012-12-17 DIAGNOSIS — R112 Nausea with vomiting, unspecified: Secondary | ICD-10-CM | POA: Insufficient documentation

## 2012-12-17 DIAGNOSIS — F0781 Postconcussional syndrome: Secondary | ICD-10-CM | POA: Insufficient documentation

## 2012-12-17 DIAGNOSIS — Z91040 Latex allergy status: Secondary | ICD-10-CM | POA: Insufficient documentation

## 2012-12-17 MED ORDER — PROMETHAZINE HCL 25 MG PO TABS: 25.0000 mg | ORAL_TABLET | Freq: Four times a day (QID) | ORAL | Status: DC | PRN

## 2012-12-17 MED ORDER — FAMOTIDINE IN NACL 20-0.9 MG/50ML-% IV SOLN: 20.0000 mg | INTRAVENOUS | Status: DC

## 2012-12-17 MED ORDER — PROMETHAZINE HCL 25 MG/ML IJ SOLN: 25.0000 mg | Freq: Once | INTRAMUSCULAR | Status: DC

## 2012-12-17 MED ORDER — IBUPROFEN 600 MG PO TABS: 600.0000 mg | ORAL_TABLET | Freq: Four times a day (QID) | ORAL | Status: DC | PRN

## 2012-12-17 MED ORDER — SODIUM CHLORIDE 0.9 % IV BOLUS (SEPSIS): 1000.0000 mL | Freq: Once | INTRAVENOUS | Status: DC

## 2012-12-17 MED ORDER — PROMETHAZINE HCL 25 MG RE SUPP: 25.0000 mg | Freq: Four times a day (QID) | RECTAL | Status: DC | PRN

## 2012-12-17 MED ORDER — OXYCODONE-ACETAMINOPHEN 5-325 MG PO TABS: 1.0000 | ORAL_TABLET | ORAL | Status: DC | PRN

## 2012-12-17 MED ORDER — OXYCODONE-ACETAMINOPHEN 5-325 MG PO TABS: 2.0000 | ORAL_TABLET | Freq: Once | ORAL | Status: DC

## 2012-12-17 MED ORDER — BACITRACIN ZINC 500 UNIT/GM EX OINT
TOPICAL_OINTMENT | CUTANEOUS | Status: AC
  Administered 2012-12-17: 1 via TOPICAL
  Filled 2012-12-17: qty 0.9

## 2012-12-17 NOTE — ED Notes (Addendum)
Pt recently seen x 2 for headaches post fall that occurred on 7/15 pt reports headaches are worse and vomiting x 2 days. Pt reports that she fell approximately 9 ft. Pt has had 2 CTs since fall and states pain is still persistent with vomiting.   Pt still has bruising to bil eyes. Rt wrist is in splint.

## 2012-12-18 NOTE — ED Provider Notes (Signed)
History    CSN: [id number] Arrival date & time 12/17/12  1478  First MD Initiated Contact with Patient 12/17/12 [phone]     Chief Complaint  Patient presents with  . Headache   (Consider location/radiation/quality/duration/timing/severity/associated sxs/prior Treatment) HPI Comments: Joanna Perry is a 42 y.o. Female presenting for re-evaluation of persistent headaches and nausea, difficulty concentrating and having insomnia since she fell off her deck, hitting her head with positive LOC 6 days ago.   She was seen again the next day due to worsening neurologic symptoms including short term memory loss and lethargy which have improved,  But still with complaint of persistent generalized headache, nausea which is not controlled with the zofran prescribed and persistent difficulty concentrating and sleeping per above.  She has had 2 head Ct's at her previous 2 visits,  Both stable and was diagnosed at her second visit with post concussion syndrome.  She has not followed up with neurology yet due to financial reasons.  Additionally, she still has complaint of pain in her right wrist but has not yet followed up with ortho.  She has complaint of the splint hurting her forearm. She has taken ibuprofen without pain relief, has also used zofran with no relief of nausea and she has completed the hydrocodone prescribed at her last visit which did not relieve her headache pain.  She denies dizziness, visual changes, she has vomited twice in the past several days, otherwise has been able to maintain po intake.    The history is provided by the patient.   Past Medical History  Diagnosis Date  . Gout   . Migraine   . MRSA infection   . Hypertension   . Kidney infection    Past Surgical History  Procedure Laterality Date  . Left leg      MRSA   No family history on file. History  Substance Use Topics  . Smoking status: Never Smoker   . Smokeless tobacco: Not on file  . Alcohol Use: Yes   Comment: last night   OB History   Grav Para Term Preterm Abortions TAB SAB Ect Mult Living                 Review of Systems  Constitutional: Negative for fever and chills.  HENT: Positive for facial swelling. Negative for hearing loss, congestion, sore throat, neck pain, tinnitus and ear discharge.   Eyes: Negative.   Respiratory: Negative for chest tightness and shortness of breath.   Cardiovascular: Negative for chest pain.  Gastrointestinal: Positive for nausea and vomiting. Negative for abdominal pain.  Genitourinary: Negative.   Musculoskeletal: Negative for joint swelling and arthralgias.  Skin: Positive for color change. Negative for rash and wound.       Bruising   Neurological: Positive for headaches. Negative for dizziness, facial asymmetry, weakness, light-headedness and numbness.  Psychiatric/Behavioral: Positive for sleep disturbance and decreased concentration. Negative for confusion.    Allergies  Codeine; Other; Peach; Strawberry; Toradol; Tramadol; and Latex  Home Medications   Current Outpatient Rx  Name  Route  Sig  Dispense  Refill  . ibuprofen (ADVIL,MOTRIN) 200 MG tablet   Oral   Take 200 mg by mouth every 6 (six) hours as needed for pain.         Marland Kitchen lisinopril-hydrochlorothiazide (PRINZIDE,ZESTORETIC) 10-12.5 MG per tablet   Oral   Take 1 tablet by mouth daily.         Marland Kitchen ibuprofen (ADVIL,MOTRIN) 600 MG tablet  Oral   Take 1 tablet (600 mg total) by mouth every 6 (six) hours as needed for pain.   30 tablet   0   . oxyCODONE-acetaminophen (PERCOCET/ROXICET) 5-325 MG per tablet   Oral   Take 1 tablet by mouth every 4 (four) hours as needed for pain.   10 tablet   0   . promethazine (PHENERGAN) 25 MG suppository   Rectal   Place 1 suppository (25 mg total) rectally every 6 (six) hours as needed for nausea (Only if you vomit the phenergan tablet).   12 each   0   . promethazine (PHENERGAN) 25 MG tablet   Oral   Take 1 tablet (25 mg  total) by mouth every 6 (six) hours as needed for nausea.   15 tablet   0    BP 125/95  Pulse 77  Temp(Src) 98.8 F (37.1 C) (Oral)  Resp 18  Ht 5\' 1"  (1.549 m)  Wt 168 lb (76.204 kg)  BMI 31.76 kg/m2  SpO2 98%  LMP 11/20/2012 Physical Exam  Nursing note and vitals reviewed. Constitutional: She is oriented to person, place, and time. She appears well-developed and well-nourished.  HENT:  Head: Normocephalic. Head is with contusion. Head is without Battle's sign, without right periorbital erythema and without left periorbital erythema.    Eyes: Conjunctivae are normal.  Neck: Normal range of motion. No spinous process tenderness present.  Cardiovascular: Normal rate, regular rhythm, normal heart sounds and intact distal pulses.   Pulmonary/Chest: Effort normal and breath sounds normal. She has no wheezes.  Abdominal: Soft. Bowel sounds are normal. There is no tenderness.  Musculoskeletal: Normal range of motion.       Right wrist: She exhibits bony tenderness. She exhibits no deformity.  ttp right scaphoid and across dorsal wrist to lateral wrist.  Neurological: She is alert and oriented to person, place, and time. She has normal strength. No cranial nerve deficit or sensory deficit. Coordination and gait normal. GCS eye subscore is 4. GCS verbal subscore is 5. GCS motor subscore is 6.  Skin: Skin is warm and dry.  Old appearing ecchymosis chest wall,  Right flank.  Abrasions right upper back. Faint ecchymosis forehead, edema appears resolved.  Psychiatric: She has a normal mood and affect.    ED Course  Procedures (including critical care time) Labs Reviewed - No data to display No results found. 1. Post concussion syndrome     MDM  Discussed patient with Dr. Hyacinth Meeker prior to dc home.  She was encouraged to f/u with neurology.  Prior 2 ed visits reviewed along with the 2 head CT's performed confirming no intracranial trauma.  Pt's sx are c/w post concussion syndrome.  Info  was given regarding this condition. She was given a replacement radial gutter splint as she complained about it cutting into her forearm and is dirty.  This was replaced for her - encouraged f/u with ortho as previously referred.  Prescribed a few additional oxycodone,  Also prescribed phenergan in place of zofran for nausea relief, ibuprofen which may also help with headaches and hand pain.  The patient appears reasonably screened and/or stabilized for discharge and I doubt any other medical condition or other [facility] requiring further screening, evaluation, or treatment in the ED at this time prior to discharge.   Burgess Amor, PA-C 12/18/12 [phone]

## 2012-12-18 NOTE — ED Provider Notes (Signed)
Medical screening examination/treatment/procedure(s) were performed by non-physician practitioner and as supervising physician I was immediately available for consultation/collaboration.    Vida Roller, MD 12/18/12 [phone]

## 2012-12-21 ENCOUNTER — Ambulatory Visit (HOSPITAL_COMMUNITY)
Admission: RE | Admit: 2012-12-21 | Discharge: 2012-12-21 | Disposition: A | Discharge status: Home / Self Care | Payer: Self-pay | Source: Ambulatory Visit | Attending: Nurse Practitioner | Admitting: Nurse Practitioner

## 2012-12-21 DIAGNOSIS — R102 Pelvic and perineal pain: Secondary | ICD-10-CM

## 2012-12-21 DIAGNOSIS — N926 Irregular menstruation, unspecified: Secondary | ICD-10-CM | POA: Insufficient documentation

## 2012-12-21 DIAGNOSIS — N939 Abnormal uterine and vaginal bleeding, unspecified: Secondary | ICD-10-CM | POA: Insufficient documentation

## 2012-12-21 DIAGNOSIS — R9389 Abnormal findings on diagnostic imaging of other specified body structures: Secondary | ICD-10-CM | POA: Insufficient documentation

## 2012-12-21 DIAGNOSIS — N949 Unspecified condition associated with female genital organs and menstrual cycle: Secondary | ICD-10-CM | POA: Insufficient documentation

## 2013-03-30 ENCOUNTER — Emergency Department (HOSPITAL_COMMUNITY)
Admission: EM | Admit: 2013-03-30 | Discharge: 2013-03-30 | Disposition: A | Discharge status: Home / Self Care | Payer: Self-pay | Attending: Emergency Medicine | Admitting: Emergency Medicine

## 2013-03-30 ENCOUNTER — Encounter (HOSPITAL_COMMUNITY): Payer: Self-pay | Admitting: Emergency Medicine

## 2013-03-30 DIAGNOSIS — N898 Other specified noninflammatory disorders of vagina: Secondary | ICD-10-CM | POA: Insufficient documentation

## 2013-03-30 DIAGNOSIS — I10 Essential (primary) hypertension: Secondary | ICD-10-CM | POA: Insufficient documentation

## 2013-03-30 DIAGNOSIS — N949 Unspecified condition associated with female genital organs and menstrual cycle: Secondary | ICD-10-CM | POA: Insufficient documentation

## 2013-03-30 DIAGNOSIS — M545 Low back pain, unspecified: Secondary | ICD-10-CM | POA: Insufficient documentation

## 2013-03-30 DIAGNOSIS — N946 Dysmenorrhea, unspecified: Secondary | ICD-10-CM | POA: Insufficient documentation

## 2013-03-30 DIAGNOSIS — Z79899 Other long term (current) drug therapy: Secondary | ICD-10-CM | POA: Insufficient documentation

## 2013-03-30 DIAGNOSIS — Z862 Personal history of diseases of the blood and blood-forming organs and certain disorders involving the immune mechanism: Secondary | ICD-10-CM | POA: Insufficient documentation

## 2013-03-30 DIAGNOSIS — Z8639 Personal history of other endocrine, nutritional and metabolic disease: Secondary | ICD-10-CM | POA: Insufficient documentation

## 2013-03-30 DIAGNOSIS — D219 Benign neoplasm of connective and other soft tissue, unspecified: Secondary | ICD-10-CM

## 2013-03-30 DIAGNOSIS — Z91040 Latex allergy status: Secondary | ICD-10-CM | POA: Insufficient documentation

## 2013-03-30 DIAGNOSIS — L089 Local infection of the skin and subcutaneous tissue, unspecified: Secondary | ICD-10-CM | POA: Insufficient documentation

## 2013-03-30 DIAGNOSIS — N390 Urinary tract infection, site not specified: Secondary | ICD-10-CM | POA: Insufficient documentation

## 2013-03-30 DIAGNOSIS — D259 Leiomyoma of uterus, unspecified: Secondary | ICD-10-CM | POA: Insufficient documentation

## 2013-03-30 DIAGNOSIS — B9689 Other specified bacterial agents as the cause of diseases classified elsewhere: Secondary | ICD-10-CM | POA: Insufficient documentation

## 2013-03-30 DIAGNOSIS — R102 Pelvic and perineal pain: Secondary | ICD-10-CM

## 2013-03-30 DIAGNOSIS — R21 Rash and other nonspecific skin eruption: Secondary | ICD-10-CM | POA: Insufficient documentation

## 2013-03-30 DIAGNOSIS — Z8614 Personal history of Methicillin resistant Staphylococcus aureus infection: Secondary | ICD-10-CM | POA: Insufficient documentation

## 2013-03-30 DIAGNOSIS — G43909 Migraine, unspecified, not intractable, without status migrainosus: Secondary | ICD-10-CM | POA: Insufficient documentation

## 2013-03-30 DIAGNOSIS — A499 Bacterial infection, unspecified: Secondary | ICD-10-CM | POA: Insufficient documentation

## 2013-03-30 DIAGNOSIS — Z3202 Encounter for pregnancy test, result negative: Secondary | ICD-10-CM | POA: Insufficient documentation

## 2013-03-30 LAB — WET PREP, GENITAL: Clue Cells Wet Prep HPF POC: NONE SEEN

## 2013-03-30 LAB — URINALYSIS, ROUTINE W REFLEX MICROSCOPIC
Bilirubin Urine: NEGATIVE
Nitrite: NEGATIVE
Protein, ur: NEGATIVE mg/dL
Specific Gravity, Urine: 1.02 (ref 1.005–1.030)
Urobilinogen, UA: 0.2 mg/dL (ref 0.0–1.0)

## 2013-03-30 LAB — CBC WITH DIFFERENTIAL/PLATELET
Eosinophils Relative: 2 % (ref 0–5)
HCT: 40.1 % (ref 36.0–46.0)
Hemoglobin: 14.1 g/dL (ref 12.0–15.0)
Lymphocytes Relative: 30 % (ref 12–46)
Lymphs Abs: 2.5 10*3/uL (ref 0.7–4.0)
MCV: 87.2 fL (ref 78.0–100.0)
Monocytes Absolute: 0.6 10*3/uL (ref 0.1–1.0)
Monocytes Relative: 7 % (ref 3–12)
Neutro Abs: 5.2 10*3/uL (ref 1.7–7.7)
RBC: 4.6 MIL/uL (ref 3.87–5.11)
WBC: 8.5 10*3/uL (ref 4.0–10.5)

## 2013-03-30 LAB — URINE MICROSCOPIC-ADD ON

## 2013-03-30 MED ORDER — OXYCODONE-ACETAMINOPHEN 5-325 MG PO TABS
1.0000 | ORAL_TABLET | Freq: Once | ORAL | Status: AC
  Administered 2013-03-30: 1 via ORAL
  Filled 2013-03-30: qty 1

## 2013-03-30 MED ORDER — CEPHALEXIN 500 MG PO CAPS: 500.0000 mg | ORAL_CAPSULE | Freq: Three times a day (TID) | ORAL | Status: DC

## 2013-03-30 MED ORDER — SULFAMETHOXAZOLE-TRIMETHOPRIM 800-160 MG PO TABS: 1.0000 | ORAL_TABLET | Freq: Two times a day (BID) | ORAL | Status: AC

## 2013-03-30 MED ORDER — OXYCODONE-ACETAMINOPHEN 5-325 MG PO TABS: 1.0000 | ORAL_TABLET | ORAL | Status: DC | PRN

## 2013-03-30 MED ORDER — PROMETHAZINE HCL 12.5 MG PO TABS
25.0000 mg | ORAL_TABLET | Freq: Once | ORAL | Status: AC
  Administered 2013-03-30: 25 mg via ORAL
  Filled 2013-03-30: qty 2

## 2013-03-30 NOTE — ED Notes (Signed)
Pt c/o lower back, lower abd pain that is the same type pain as pt has been having since being diagnosed with ovarian cysts in august, vaginal odor, white discharge since last week, nausea, vaginal bleeding every other week 3 years. Has referral to obgyn for hysterectomy next month, states that the ibuprofen that she has been getting from the health department in not working for the pain. Pt is also concerned that she has mrsa to inside of both thighs.

## 2013-03-30 NOTE — ED Notes (Signed)
1. Pt reports yeast infection for 2 weeks. 2. Strong odor to her urine, for 1 week.  Thinks she may have a uti.  +nausea.  Unsure of fever. 3. Wound to left upper thigh that she thinks may be her "mrsa acting up"

## 2013-03-30 NOTE — ED Provider Notes (Signed)
CSN: [id number]     Arrival date & time 03/30/13  1147 History   First MD Initiated Contact with Patient 03/30/13 1214     Chief Complaint  Patient presents with  . Vaginitis  . Urinary Tract Infection  . Wound Infection   (Consider location/radiation/quality/duration/timing/severity/associated sxs/prior Treatment) Patient is a 42 y.o. female presenting with urinary tract infection. The history is provided by the patient.  Urinary Tract Infection This is a new problem. Associated symptoms include abdominal pain and a rash. Pertinent negatives include no chest pain, coughing, fever, headaches, nausea or vomiting.   CHANTAE SOO is a 42 y.o. female who presents to the ED with lower abdominal pain and low back pain that has been off and on since diagnoses with Ovarian cyst in August and uterine fibroids. She is scheduled to have a hysterectomy next month. Today she is also have UTI symptoms, vaginal discharge and vaginal bleeding. She has been taking ibuprofen without relief. Also complains of area on the inner aspect of her left thigh that is red and tender. History of MRSA and wants to get treated before it gets bad and has to be drained.  Past Medical History  Diagnosis Date  . Gout   . Migraine   . MRSA infection   . Hypertension   . Kidney infection    Past Surgical History  Procedure Laterality Date  . Left leg      MRSA   No family history on file. History  Substance Use Topics  . Smoking status: Never Smoker   . Smokeless tobacco: Not on file  . Alcohol Use: Yes     Comment: last night   OB History   Grav Para Term Preterm Abortions TAB SAB Ect Mult Living                 Review of Systems  Constitutional: Negative for fever and appetite change.  HENT: Negative.   Eyes: Negative for pain.  Respiratory: Negative for cough.   Cardiovascular: Negative for chest pain.  Gastrointestinal: Positive for abdominal pain. Negative for nausea, vomiting and diarrhea.   Genitourinary: Positive for urgency, frequency, vaginal bleeding, vaginal discharge and pelvic pain. Negative for dysuria.  Musculoskeletal:       Redness left thigh  Skin: Positive for rash.  Allergic/Immunologic: Negative for immunocompromised state.  Neurological: Negative for dizziness and headaches.  Psychiatric/Behavioral: The patient is not nervous/anxious.     Allergies  Codeine; Other; Peach; Strawberry; Toradol; Tramadol; and Latex  Home Medications   Current Outpatient Rx  Name  Route  Sig  Dispense  Refill  . ibuprofen (ADVIL,MOTRIN) 200 MG tablet   Oral   Take 200 mg by mouth every 6 (six) hours as needed for pain.         Marland Kitchen lisinopril (PRINIVIL,ZESTRIL) 20 MG tablet   Oral   Take 20 mg by mouth daily.         . potassium chloride SA (K-DUR,KLOR-CON) 20 MEQ tablet   Oral   Take 20 mEq by mouth 2 (two) times daily.          BP 147/98  Pulse 84  Temp(Src) 98 F (36.7 C) (Oral)  Resp 20  Ht 5\' 1"  (1.549 m)  Wt 176 lb 8 oz (80.06 kg)  BMI 33.37 kg/m2  SpO2 100%  LMP 02/25/2013 Physical Exam  Nursing note and vitals reviewed. Constitutional: She is oriented to person, place, and time. She appears well-developed and well-nourished.  HENT:  Head: Normocephalic and atraumatic.  Eyes: EOM are normal.  Neck: Neck supple.  Cardiovascular: Normal rate.   Pulmonary/Chest: Effort normal.  Abdominal: Soft. There is tenderness in the suprapubic area. There is no rebound, no guarding and no CVA tenderness.  Genitourinary:  External genitalia without lesions. Moderate blood vaginal vault. Positive CMT, bilateral adnexal tenderness, uterus enlarged.   Musculoskeletal: Normal range of motion.  Neurological: She is alert and oriented to person, place, and time. No cranial nerve deficit.  Skin: Skin is warm and dry.  Psychiatric: She has a normal mood and affect. Her behavior is normal.    ED Course  Procedures Results for orders placed during the hospital  encounter of 03/30/13 (from the past 24 hour(s))  URINALYSIS, ROUTINE W REFLEX MICROSCOPIC     Status: Abnormal   Collection Time    03/30/13 12:06 PM      Result Value Range   Color, Urine YELLOW  YELLOW   APPearance CLEAR  CLEAR   Specific Gravity, Urine 1.020  1.005 - 1.030   pH 7.0  5.0 - 8.0   Glucose, UA NEGATIVE  NEGATIVE mg/dL   Hgb urine dipstick LARGE (*) NEGATIVE   Bilirubin Urine NEGATIVE  NEGATIVE   Ketones, ur NEGATIVE  NEGATIVE mg/dL   Protein, ur NEGATIVE  NEGATIVE mg/dL   Urobilinogen, UA 0.2  0.0 - 1.0 mg/dL   Nitrite NEGATIVE  NEGATIVE   Leukocytes, UA NEGATIVE  NEGATIVE  PREGNANCY, URINE     Status: None   Collection Time    03/30/13 12:06 PM      Result Value Range   Preg Test, Ur NEGATIVE  NEGATIVE  URINE MICROSCOPIC-ADD ON     Status: Abnormal   Collection Time    03/30/13 12:06 PM      Result Value Range   Squamous Epithelial / LPF FEW (*) RARE   WBC, UA 0-2  <3 WBC/hpf   RBC / HPF 11-20  <3 RBC/hpf   Bacteria, UA RARE  RARE  WET PREP, GENITAL     Status: Abnormal   Collection Time    03/30/13  1:03 PM      Result Value Range   Yeast Wet Prep HPF POC NONE SEEN  NONE SEEN   Trich, Wet Prep NONE SEEN  NONE SEEN   Clue Cells Wet Prep HPF POC NONE SEEN  NONE SEEN   WBC, Wet Prep HPF POC FEW (*) NONE SEEN  CBC WITH DIFFERENTIAL     Status: None   Collection Time    03/30/13  1:14 PM      Result Value Range   WBC 8.5  4.0 - 10.5 K/uL   RBC 4.60  3.87 - 5.11 MIL/uL   Hemoglobin 14.1  12.0 - 15.0 g/dL   HCT 19.1  47.8 - 29.5 %   MCV 87.2  78.0 - 100.0 fL   MCH 30.7  26.0 - 34.0 pg   MCHC 35.2  30.0 - 36.0 g/dL   RDW 62.1  30.8 - 65.7 %   Platelets 316  150 - 400 K/uL   Neutrophils Relative % 61  43 - 77 %   Neutro Abs 5.2  1.7 - 7.7 K/uL   Lymphocytes Relative 30  12 - 46 %   Lymphs Abs 2.5  0.7 - 4.0 K/uL   Monocytes Relative 7  3 - 12 %   Monocytes Absolute 0.6  0.1 - 1.0 K/uL   Eosinophils Relative 2  0 - 5 %  Eosinophils Absolute 0.2  0.0  - 0.7 K/uL   Basophils Relative 0  0 - 1 %   Basophils Absolute 0.0  0.0 - 0.1 K/uL    EKG Interpretation   None       MDM  42 y.o. female with abdominal pain x 2 months. Scheduled for hysterectomy. Here with dysmenorrhea and skin infection to left thigh.  Will treat for pain until patient goes next week for hysterectomy will treat skin infection. Patient states she always has to have Bactrim and Keflex when she gets MRSA.  Discussed with the patient and all questioned fully answered. She will return  if any problems arise.   Medication List    TAKE these medications       cephALEXin 500 MG capsule  Commonly known as:  KEFLEX  Take 1 capsule (500 mg total) by mouth 3 (three) times daily.     oxyCODONE-acetaminophen 5-325 MG per tablet  Commonly known as:  ROXICET  Take 1 tablet by mouth every 4 (four) hours as needed for pain.     sulfamethoxazole-trimethoprim 800-160 MG per tablet  Commonly known as:  BACTRIM DS,SEPTRA DS  Take 1 tablet by mouth 2 (two) times daily.      ASK your doctor about these medications       ibuprofen 200 MG tablet  Commonly known as:  ADVIL,MOTRIN  Take 200 mg by mouth every 6 (six) hours as needed for pain.     lisinopril 20 MG tablet  Commonly known as:  PRINIVIL,ZESTRIL  Take 20 mg by mouth daily.     potassium chloride SA 20 MEQ tablet  Commonly known as:  K-DUR,KLOR-CON  Take 20 mEq by mouth 2 (two) times daily.            Janne Napoleon, [place] 03/30/13 [phone]

## 2013-03-30 NOTE — ED Notes (Signed)
Pelvic exam performed by [facility] NP, witness present, pt tolerated well, cultures obtained and sent to lab,

## 2013-03-31 NOTE — ED Provider Notes (Signed)
History/physical exam/procedure(s) were performed by non-physician practitioner and as supervising physician I was immediately available for consultation/collaboration. I have reviewed all notes and am in agreement with care and plan.   Marisabel Macpherson S Serra Younan, MD 03/31/13 1602 

## 2013-09-29 ENCOUNTER — Emergency Department (HOSPITAL_COMMUNITY)
Admission: EM | Admit: 2013-09-29 | Discharge: 2013-09-29 | Disposition: A | Discharge status: Home / Self Care | Payer: Self-pay | Attending: Emergency Medicine | Admitting: Emergency Medicine

## 2013-09-29 ENCOUNTER — Encounter (HOSPITAL_COMMUNITY): Payer: Self-pay | Admitting: Emergency Medicine

## 2013-09-29 ENCOUNTER — Emergency Department (HOSPITAL_COMMUNITY): Payer: Self-pay

## 2013-09-29 DIAGNOSIS — S8990XA Unspecified injury of unspecified lower leg, initial encounter: Secondary | ICD-10-CM | POA: Insufficient documentation

## 2013-09-29 DIAGNOSIS — S0993XA Unspecified injury of face, initial encounter: Secondary | ICD-10-CM | POA: Insufficient documentation

## 2013-09-29 DIAGNOSIS — Z791 Long term (current) use of non-steroidal anti-inflammatories (NSAID): Secondary | ICD-10-CM | POA: Insufficient documentation

## 2013-09-29 DIAGNOSIS — Z79899 Other long term (current) drug therapy: Secondary | ICD-10-CM | POA: Insufficient documentation

## 2013-09-29 DIAGNOSIS — S99929A Unspecified injury of unspecified foot, initial encounter: Secondary | ICD-10-CM

## 2013-09-29 DIAGNOSIS — M109 Gout, unspecified: Secondary | ICD-10-CM | POA: Insufficient documentation

## 2013-09-29 DIAGNOSIS — I10 Essential (primary) hypertension: Secondary | ICD-10-CM | POA: Insufficient documentation

## 2013-09-29 DIAGNOSIS — S0083XA Contusion of other part of head, initial encounter: Secondary | ICD-10-CM | POA: Insufficient documentation

## 2013-09-29 DIAGNOSIS — Z87448 Personal history of other diseases of urinary system: Secondary | ICD-10-CM | POA: Insufficient documentation

## 2013-09-29 DIAGNOSIS — S0003XA Contusion of scalp, initial encounter: Secondary | ICD-10-CM | POA: Insufficient documentation

## 2013-09-29 DIAGNOSIS — Z8614 Personal history of Methicillin resistant Staphylococcus aureus infection: Secondary | ICD-10-CM | POA: Insufficient documentation

## 2013-09-29 DIAGNOSIS — Z8619 Personal history of other infectious and parasitic diseases: Secondary | ICD-10-CM | POA: Insufficient documentation

## 2013-09-29 DIAGNOSIS — S199XXA Unspecified injury of neck, initial encounter: Secondary | ICD-10-CM | POA: Insufficient documentation

## 2013-09-29 DIAGNOSIS — Z91040 Latex allergy status: Secondary | ICD-10-CM | POA: Insufficient documentation

## 2013-09-29 DIAGNOSIS — S1093XA Contusion of unspecified part of neck, initial encounter: Secondary | ICD-10-CM

## 2013-09-29 DIAGNOSIS — S99919A Unspecified injury of unspecified ankle, initial encounter: Secondary | ICD-10-CM

## 2013-09-29 DIAGNOSIS — S0990XA Unspecified injury of head, initial encounter: Secondary | ICD-10-CM | POA: Insufficient documentation

## 2013-09-29 MED ORDER — ACETAMINOPHEN 500 MG PO TABS
1000.0000 mg | ORAL_TABLET | Freq: Once | ORAL | Status: AC
  Administered 2013-09-29: 1000 mg via ORAL
  Filled 2013-09-29: qty 2

## 2013-09-29 MED ORDER — OXYCODONE-ACETAMINOPHEN 5-325 MG PO TABS
1.0000 | ORAL_TABLET | Freq: Once | ORAL | Status: AC
  Administered 2013-09-29: 1 via ORAL
  Filled 2013-09-29: qty 1

## 2013-09-29 MED ORDER — IBUPROFEN 800 MG PO TABS
800.0000 mg | ORAL_TABLET | Freq: Once | ORAL | Status: AC
  Administered 2013-09-29: 800 mg via ORAL
  Filled 2013-09-29: qty 1

## 2013-09-29 MED ORDER — OXYCODONE-ACETAMINOPHEN 5-325 MG PO TABS: 2.0000 | ORAL_TABLET | ORAL | Status: DC | PRN
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
## 2013-09-29 NOTE — ED Notes (Signed)
Patient is resting comfortably. 
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
## 2013-09-29 NOTE — Discharge Instructions (Signed)
X-rays show no fracture. You'll be sore for several days. Expect black and blue marks to stay. Pain medication. Work note given.

## 2013-12-17 ENCOUNTER — Encounter (HOSPITAL_COMMUNITY): Payer: Self-pay | Admitting: Emergency Medicine

## 2013-12-17 ENCOUNTER — Emergency Department (HOSPITAL_COMMUNITY): Payer: BC Managed Care – PPO

## 2013-12-17 ENCOUNTER — Emergency Department (HOSPITAL_COMMUNITY)
Admission: EM | Admit: 2013-12-17 | Discharge: 2013-12-17 | Disposition: A | Discharge status: Home / Self Care | Payer: BC Managed Care – PPO | Attending: Emergency Medicine | Admitting: Emergency Medicine

## 2013-12-17 DIAGNOSIS — S0083XA Contusion of other part of head, initial encounter: Secondary | ICD-10-CM | POA: Insufficient documentation

## 2013-12-17 DIAGNOSIS — IMO0002 Reserved for concepts with insufficient information to code with codable children: Secondary | ICD-10-CM | POA: Insufficient documentation

## 2013-12-17 DIAGNOSIS — S0003XA Contusion of scalp, initial encounter: Secondary | ICD-10-CM | POA: Insufficient documentation

## 2013-12-17 DIAGNOSIS — Z91040 Latex allergy status: Secondary | ICD-10-CM | POA: Insufficient documentation

## 2013-12-17 DIAGNOSIS — G43909 Migraine, unspecified, not intractable, without status migrainosus: Secondary | ICD-10-CM | POA: Insufficient documentation

## 2013-12-17 DIAGNOSIS — Z862 Personal history of diseases of the blood and blood-forming organs and certain disorders involving the immune mechanism: Secondary | ICD-10-CM | POA: Insufficient documentation

## 2013-12-17 DIAGNOSIS — I10 Essential (primary) hypertension: Secondary | ICD-10-CM | POA: Insufficient documentation

## 2013-12-17 DIAGNOSIS — S199XXA Unspecified injury of neck, initial encounter: Principal | ICD-10-CM

## 2013-12-17 DIAGNOSIS — S0993XA Unspecified injury of face, initial encounter: Secondary | ICD-10-CM | POA: Insufficient documentation

## 2013-12-17 DIAGNOSIS — S298XXA Other specified injuries of thorax, initial encounter: Secondary | ICD-10-CM | POA: Insufficient documentation

## 2013-12-17 DIAGNOSIS — Z79899 Other long term (current) drug therapy: Secondary | ICD-10-CM | POA: Insufficient documentation

## 2013-12-17 DIAGNOSIS — T148XXA Other injury of unspecified body region, initial encounter: Secondary | ICD-10-CM

## 2013-12-17 DIAGNOSIS — Z87448 Personal history of other diseases of urinary system: Secondary | ICD-10-CM | POA: Insufficient documentation

## 2013-12-17 DIAGNOSIS — Z8614 Personal history of Methicillin resistant Staphylococcus aureus infection: Secondary | ICD-10-CM | POA: Insufficient documentation

## 2013-12-17 DIAGNOSIS — R220 Localized swelling, mass and lump, head: Secondary | ICD-10-CM

## 2013-12-17 DIAGNOSIS — S1093XA Contusion of unspecified part of neck, initial encounter: Secondary | ICD-10-CM

## 2013-12-17 DIAGNOSIS — Z8639 Personal history of other endocrine, nutritional and metabolic disease: Secondary | ICD-10-CM | POA: Insufficient documentation

## 2013-12-17 MED ORDER — HYDROCODONE-ACETAMINOPHEN 5-325 MG PO TABS: 1.0000 | ORAL_TABLET | Freq: Four times a day (QID) | ORAL | Status: DC | PRN
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
## 2013-12-17 NOTE — Discharge Instructions (Signed)
Abrasion An abrasion is a cut or scrape of the skin. Abrasions do not extend through all layers of the skin and most heal within 10 days. It is important to care for your abrasion properly to prevent infection. CAUSES  Most abrasions are caused by falling on, or gliding across, the ground or other surface. When your skin rubs on something, the outer and inner layer of skin rubs off, causing an abrasion. DIAGNOSIS  Your caregiver will be able to diagnose an abrasion during a physical exam.  TREATMENT  Your treatment depends on how large and deep the abrasion is. Generally, your abrasion will be cleaned with water and a mild soap to remove any dirt or debris. An antibiotic ointment may be put over the abrasion to prevent an infection. A bandage (dressing) may be wrapped around the abrasion to keep it from getting dirty.  You may need a tetanus shot if:  You cannot remember when you had your last tetanus shot.  You have never had a tetanus shot.  The injury broke your skin. If you get a tetanus shot, your arm may swell, get red, and feel warm to the touch. This is common and not a problem. If you need a tetanus shot and you choose not to have one, there is a rare chance of getting tetanus. Sickness from tetanus can be serious.  HOME CARE INSTRUCTIONS   If a dressing was applied, change it at least once a day or as directed by your caregiver. If the bandage sticks, soak it off with warm water.   Wash the area with water and a mild soap to remove all the ointment 2 times a day. Rinse off the soap and pat the area dry with a clean towel.   Reapply any ointment as directed by your caregiver. This will help prevent infection and keep the bandage from sticking. Use gauze over the wound and under the dressing to help keep the bandage from sticking.   Change your dressing right away if it becomes wet or dirty.   Only take over-the-counter or prescription medicines for pain, discomfort, or fever as  directed by your caregiver.   Follow up with your caregiver within 24-48 hours for a wound check, or as directed. If you were not given a wound-check appointment, look closely at your abrasion for redness, swelling, or pus. These are signs of infection. SEEK IMMEDIATE MEDICAL CARE IF:   You have increasing pain in the wound.   You have redness, swelling, or tenderness around the wound.   You have pus coming from the wound.   You have a fever or persistent symptoms for more than 2-3 days.  You have a fever and your symptoms suddenly get worse.  You have a bad smell coming from the wound or dressing.  MAKE SURE YOU:   Understand these instructions.  Will watch your condition.  Will get help right away if you are not doing well or get worse. Document Released: 02/23/2005 Document Revised: 05/02/2012 Document Reviewed: 04/19/2011 [facility] Patient Information 2015 [place], [place]. This information is not intended to replace advice given to you by your health care provider. Make sure you discuss any questions you have with your health care provider.  Assault, General Assault includes any behavior, whether intentional or reckless, which results in bodily injury to another person and/or damage to property. Included in this would be any behavior, intentional or reckless, that by its nature would be understood (interpreted) by a reasonable person as  intent to harm another person or to damage his/her property. Threats may be oral or written. They may be communicated through regular mail, computer, fax, or phone. These threats may be direct or implied. FORMS OF ASSAULT INCLUDE:  Physically assaulting a person. This includes physical threats to inflict physical harm as well as:  Slapping.  Hitting.  Poking.  Kicking.  Punching.  Pushing.  Arson.  Sabotage.  Equipment vandalism.  Damaging or destroying property.  Throwing or hitting objects.  Displaying a weapon or an  object that appears to be a weapon in a threatening manner.  Carrying a firearm of any kind.  Using a weapon to harm someone.  Using greater physical size/strength to intimidate another.  Making intimidating or threatening gestures.  Bullying.  Hazing.  Intimidating, threatening, hostile, or abusive language directed toward another person.  It communicates the intention to engage in violence against that person. And it leads a reasonable person to expect that violent behavior may occur.  Stalking another person. IF IT HAPPENS AGAIN:  Immediately call for emergency help (911 in [place]  If someone poses clear and immediate danger to you, seek legal authorities to have a protective or restraining order put in place.  Less threatening assaults can at least be reported to authorities. STEPS TO TAKE IF A SEXUAL ASSAULT HAS HAPPENED  Go to an area of safety. This may include a shelter or staying with a friend. Stay away from the area where you have been attacked. A large percentage of sexual assaults are caused by a friend, relative or associate.  If medications were given by your caregiver, take them as directed for the full length of time prescribed.  Only take over-the-counter or prescription medicines for pain, discomfort, or fever as directed by your caregiver.  If you have come in contact with a sexual disease, find out if you are to be tested again. If your caregiver is concerned about the HIV/AIDS virus, he/she may require you to have continued testing for several months.  For the protection of your privacy, test results can not be given over the phone. Make sure you receive the results of your test. If your test results are not back during your visit, make an appointment with your caregiver to find out the results. Do not assume everything is normal if you have not heard from your caregiver or the medical facility. It is important for you to follow up on all of your test  results.  File appropriate papers with authorities. This is important in all assaults, even if it has occurred in a family or by a friend. SEEK MEDICAL CARE IF:  You have new problems because of your injuries.  You have problems that may be because of the medicine you are taking, such as:  Rash.  Itching.  Swelling.  Trouble breathing.  You develop belly (abdominal) pain, feel sick to your stomach (nausea) or are vomiting.  You begin to run a temperature.  You need supportive care or referral to a rape crisis center. These are centers with trained personnel who can help you get through this ordeal. SEEK IMMEDIATE MEDICAL CARE IF:  You are afraid of being threatened, beaten, or abused. In [place] call 911.  You receive new injuries related to abuse.  You develop severe pain in any area injured in the assault or have any change in your condition that concerns you.  You faint or lose consciousness.  You develop chest pain or shortness of breath. Document  Released: 05/16/2005 Document Revised: 08/08/2011 Document Reviewed: 01/02/2008 [facility] Patient Information 2015 [place], [place]. This information is not intended to replace advice given to you by your health care provider. Make sure you discuss any questions you have with your health care provider.
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
## 2013-12-18 NOTE — ED Provider Notes (Signed)
Medical screening examination/treatment/procedure(s) were performed by non-physician practitioner and as supervising physician I was immediately available for consultation/collaboration.   EKG Interpretation None        Fredia Sorrow, MD 12/18/13 [phone]

## 2014-02-19 ENCOUNTER — Encounter (HOSPITAL_COMMUNITY): Payer: Self-pay | Admitting: Emergency Medicine

## 2014-02-19 ENCOUNTER — Emergency Department (HOSPITAL_COMMUNITY)
Admission: EM | Admit: 2014-02-19 | Discharge: 2014-02-19 | Disposition: A | Discharge status: Home / Self Care | Payer: BC Managed Care – PPO | Attending: Emergency Medicine | Admitting: Emergency Medicine

## 2014-02-19 DIAGNOSIS — Z8639 Personal history of other endocrine, nutritional and metabolic disease: Secondary | ICD-10-CM | POA: Insufficient documentation

## 2014-02-19 DIAGNOSIS — R109 Unspecified abdominal pain: Secondary | ICD-10-CM | POA: Diagnosis present

## 2014-02-19 DIAGNOSIS — Z79899 Other long term (current) drug therapy: Secondary | ICD-10-CM | POA: Diagnosis not present

## 2014-02-19 DIAGNOSIS — Z8614 Personal history of Methicillin resistant Staphylococcus aureus infection: Secondary | ICD-10-CM | POA: Diagnosis not present

## 2014-02-19 DIAGNOSIS — Z3202 Encounter for pregnancy test, result negative: Secondary | ICD-10-CM | POA: Diagnosis not present

## 2014-02-19 DIAGNOSIS — Z87448 Personal history of other diseases of urinary system: Secondary | ICD-10-CM | POA: Diagnosis not present

## 2014-02-19 DIAGNOSIS — Z8742 Personal history of other diseases of the female genital tract: Secondary | ICD-10-CM | POA: Diagnosis not present

## 2014-02-19 DIAGNOSIS — I10 Essential (primary) hypertension: Secondary | ICD-10-CM | POA: Insufficient documentation

## 2014-02-19 DIAGNOSIS — Z91040 Latex allergy status: Secondary | ICD-10-CM | POA: Insufficient documentation

## 2014-02-19 DIAGNOSIS — Z862 Personal history of diseases of the blood and blood-forming organs and certain disorders involving the immune mechanism: Secondary | ICD-10-CM | POA: Diagnosis not present

## 2014-02-19 DIAGNOSIS — G8929 Other chronic pain: Secondary | ICD-10-CM | POA: Diagnosis not present

## 2014-02-19 DIAGNOSIS — G43909 Migraine, unspecified, not intractable, without status migrainosus: Secondary | ICD-10-CM | POA: Diagnosis not present

## 2014-02-19 DIAGNOSIS — Z86018 Personal history of other benign neoplasm: Secondary | ICD-10-CM

## 2014-02-19 LAB — CBC WITH DIFFERENTIAL/PLATELET
Basophils Absolute: 0 10*3/uL (ref 0.0–0.1)
Basophils Relative: 1 % (ref 0–1)
EOS ABS: 0.1 10*3/uL (ref 0.0–0.7)
EOS PCT: 1 % (ref 0–5)
HCT: 38 % (ref 36.0–46.0)
Hemoglobin: 13.3 g/dL (ref 12.0–15.0)
LYMPHS PCT: 27 % (ref 12–46)
Lymphs Abs: 1.8 10*3/uL (ref 0.7–4.0)
MCH: 30.5 pg (ref 26.0–34.0)
MCHC: 35 g/dL (ref 30.0–36.0)
MCV: 87.2 fL (ref 78.0–100.0)
Monocytes Absolute: 0.6 10*3/uL (ref 0.1–1.0)
Monocytes Relative: 9 % (ref 3–12)
Neutro Abs: 4.1 10*3/uL (ref 1.7–7.7)
Neutrophils Relative %: 62 % (ref 43–77)
PLATELETS: 285 10*3/uL (ref 150–400)
RBC: 4.36 MIL/uL (ref 3.87–5.11)
RDW: 12.6 % (ref 11.5–15.5)
WBC: 6.5 10*3/uL (ref 4.0–10.5)

## 2014-02-19 LAB — BASIC METABOLIC PANEL
ANION GAP: 10 (ref 5–15)
BUN: 9 mg/dL (ref 6–23)
CALCIUM: 9 mg/dL (ref 8.4–10.5)
CO2: 25 mEq/L (ref 19–32)
Chloride: 105 mEq/L (ref 96–112)
Creatinine, Ser: 0.84 mg/dL (ref 0.50–1.10)
GFR calc Af Amer: >90 mL/min (ref >90)
GFR, EST NON AFRICAN AMERICAN: 83 mL/min — AB (ref >90)
Glucose, Bld: 86 mg/dL (ref 70–99)
Potassium: 4.2 mEq/L (ref 3.7–5.3)
SODIUM: 140 meq/L (ref 137–147)

## 2014-02-19 LAB — URINALYSIS, ROUTINE W REFLEX MICROSCOPIC
Bilirubin Urine: NEGATIVE
Glucose, UA: NEGATIVE mg/dL
Hgb urine dipstick: NEGATIVE
KETONES UR: NEGATIVE mg/dL
NITRITE: NEGATIVE
PROTEIN: NEGATIVE mg/dL
Specific Gravity, Urine: 1.015 (ref 1.005–1.030)
Urobilinogen, UA: 0.2 mg/dL (ref 0.0–1.0)
pH: 7 (ref 5.0–8.0)

## 2014-02-19 LAB — URINE MICROSCOPIC-ADD ON

## 2014-02-19 LAB — PREGNANCY, URINE: Preg Test, Ur: NEGATIVE

## 2014-02-19 MED ORDER — IBUPROFEN 800 MG PO TABS
800.0000 mg | ORAL_TABLET | Freq: Once | ORAL | Status: AC
  Administered 2014-02-19: 800 mg via ORAL
  Filled 2014-02-19: qty 1

## 2014-02-19 NOTE — ED Provider Notes (Addendum)
CSN: [id number]     Arrival date & time 02/19/14  1356 History   First MD Initiated Contact with Patient 02/19/14 1459     Chief Complaint  Patient presents with  . Abdominal Pain     (Consider location/radiation/quality/duration/timing/severity/associated sxs/prior Treatment) HPI Comments: Patient is a 43 year old female with history of chronic abdominal pain she states related to fibroids. She states that she's been bleeding for 40 days straight and just stopped yesterday. She is having discomfort and is requesting a refill of oxycodone. She denies fevers or chills. She denies urinary complaints.  Patient is a 43 y.o. female presenting with abdominal pain. The history is provided by the patient.  Abdominal Pain Pain location:  Suprapubic Pain quality: cramping   Pain severity:  Moderate Duration: 3 years. Timing:  Constant Progression:  Worsening Chronicity:  Chronic   Past Medical History  Diagnosis Date  . Gout   . Migraine   . MRSA infection   . Hypertension   . Kidney infection    Past Surgical History  Procedure Laterality Date  . Left leg      MRSA   History reviewed. No pertinent family history. History  Substance Use Topics  . Smoking status: Never Smoker   . Smokeless tobacco: Not on file  . Alcohol Use: Yes     Comment: once a year   OB History   Grav Para Term Preterm Abortions TAB SAB Ect Mult Living                 Review of Systems  Gastrointestinal: Positive for abdominal pain.  All other systems reviewed and are negative.     Allergies  Codeine; Other; Peach; Strawberry; Toradol; Tramadol; and Latex  Home Medications   Prior to Admission medications   Medication Sig Start Date End Date Taking? Authorizing Provider  ibuprofen (ADVIL,MOTRIN) 800 MG tablet Take 800 mg by mouth every 8 (eight) hours as needed (pain).   Yes Historical Provider, MD  lisinopril (PRINIVIL,ZESTRIL) 20 MG tablet Take 20 mg by mouth daily.   Yes Historical  Provider, MD  potassium chloride SA (K-DUR,KLOR-CON) 20 MEQ tablet Take 20 mEq by mouth daily.    Yes Historical Provider, MD   BP 170/131  Pulse 85  Temp(Src) 97.6 F (36.4 C) (Oral)  Resp 20  Ht 5\' 1"  (1.549 m)  Wt 173 lb (78.472 kg)  BMI 32.70 kg/m2  SpO2 100%  LMP 02/14/2014 Physical Exam  Nursing note and vitals reviewed. Constitutional: She is oriented to person, place, and time. She appears well-developed and well-nourished. No distress.  HENT:  Head: Normocephalic and atraumatic.  Neck: Normal range of motion. Neck supple.  Cardiovascular: Normal rate, regular rhythm and normal heart sounds.   No murmur heard. Pulmonary/Chest: Effort normal and breath sounds normal. No respiratory distress. She has no wheezes.  Abdominal: Soft. Bowel sounds are normal. She exhibits no distension. There is tenderness. There is no rebound and no guarding.  There is tenderness to palpation in the suprapubic region with no rebound and no guarding.  Musculoskeletal: Normal range of motion.  Neurological: She is alert and oriented to person, place, and time.  Skin: Skin is warm and dry. She is not diaphoretic.    ED Course  Procedures (including critical care time) Labs Review Labs Reviewed  CBC WITH DIFFERENTIAL  BASIC METABOLIC PANEL  URINALYSIS, ROUTINE W REFLEX MICROSCOPIC  PREGNANCY, URINE    Imaging Review No results found.   EKG Interpretation None  MDM   Final diagnoses:  None    Patient is a 43 year old female with history of chronic lower abdominal pain she states is related to fibroids. She presents today with increased pain requesting oxycodone. On exam, she appears nontoxic and her abdomen is benign. Workup reveals no elevation of white count, normal remainder of CBC, unremarkable electrolytes, and clear urinalysis. I see no reason for further imaging. Upon reviewing the controlled substance database, the patient has had multiple prescriptions for both  hydrocodone and oxycodone from multiple providers over the past 2 months and I am uncomfortable with prescribing further narcotics to her. She states she is in the process of obtaining a primary Dr. and states she has an appointment in 2 weeks. I have advised her to call and move this appointment up. In the meantime, I will recommend Motrin as needed for her pain.    Veryl Speak, MD 02/19/14 1607   Addendum: The patient complained of the nursing staff about her care would not provided with oxycodone. She got dressed and left the department in no apparent discomfort. Several minutes later she returned to the emergency department wearing a visitors sticker and went to visit another patient with whom she apparently came here. He is also here for a painful condition for which workup has revealed no explanation. This further supports my suspicions that there is a component of drug-seeking behavior.  Veryl Speak, MD 02/19/14 [phone]

## 2014-02-19 NOTE — ED Notes (Addendum)
Pt c/o chronic abd pain from uterine fibroids. Pt reports she has been having increased pain over last 2 days. Is seen by the health department and waiting to see OB/GYN to schedule hysterectomy.  Pt was seen at [facility] last week and lab work and ultrasound done.

## 2014-02-19 NOTE — Discharge Instructions (Signed)
Motrin 600 mg every 6 hours as needed for pain.  Call tomorrow to see if your doctor's appointment can be expedited. Return to the emergency department if you develop high fever, bloody stool, or other new and concerning symptoms.   Chronic Pain Chronic pain can be defined as pain that is off and on and lasts for 3-6 months or longer. Many things cause chronic pain, which can make it difficult to make a diagnosis. There are many treatment options available for chronic pain. However, finding a treatment that works well for you may require trying various approaches until the right one is found. Many people benefit from a combination of two or more types of treatment to control their pain. SYMPTOMS  Chronic pain can occur anywhere in the body and can range from mild to very severe. Some types of chronic pain include:  Headache.  Low back pain.  Cancer pain.  Arthritis pain.  Neurogenic pain. This is pain resulting from damage to nerves. People with chronic pain may also have other symptoms such as:  Depression.  Anger.  Insomnia.  Anxiety. DIAGNOSIS  Your health care provider will help diagnose your condition over time. In many cases, the initial focus will be on excluding possible conditions that could be causing the pain. Depending on your symptoms, your health care provider may order tests to diagnose your condition. Some of these tests may include:   Blood tests.   CT scan.   MRI.   X-rays.   Ultrasounds.   Nerve conduction studies.  You may need to see a specialist.  TREATMENT  Finding treatment that works well may take time. You may be referred to a pain specialist. He or she may prescribe medicine or therapies, such as:   Mindful meditation or yoga.  Shots (injections) of numbing or pain-relieving medicines into the spine or area of pain.  Local electrical stimulation.  Acupuncture.   Massage therapy.   Aroma, color, light, or sound therapy.    Biofeedback.   Working with a physical therapist to keep from getting stiff.   Regular, gentle exercise.   Cognitive or behavioral therapy.   Group support.  Sometimes, surgery may be recommended.  HOME CARE INSTRUCTIONS   Take all medicines as directed by your health care provider.   Lessen stress in your life by relaxing and doing things such as listening to calming music.   Exercise or be active as directed by your health care provider.   Eat a healthy diet and include things such as vegetables, fruits, fish, and lean meats in your diet.   Keep all follow-up appointments with your health care provider.   Attend a support group with others suffering from chronic pain. SEEK MEDICAL CARE IF:   Your pain gets worse.   You develop a new pain that was not there before.   You cannot tolerate medicines given to you by your health care provider.   You have new symptoms since your last visit with your health care provider.  SEEK IMMEDIATE MEDICAL CARE IF:   You feel weak.   You have decreased sensation or numbness.   You lose control of bowel or bladder function.   Your pain suddenly gets much worse.   You develop shaking.  You develop chills.  You develop confusion.  You develop chest pain.  You develop shortness of breath.  MAKE SURE YOU:  Understand these instructions.  Will watch your condition.  Will get help right away if you are  not doing well or get worse. Document Released: 02/05/2002 Document Revised: 01/16/2013 Document Reviewed: 11/09/2012 [facility] Patient Information 2015 [place], [place]. This information is not intended to replace advice given to you by your health care provider. Make sure you discuss any questions you have with your health care provider.

## 2014-07-28 IMAGING — CR DG FOREARM 2V*L*
2 series · 2 of 2 positions shown · non-contrast
Comparison: None

CLINICAL DATA: Fall from deck, bruising

LEFT FOREARM - 2 VIEW

[view not recorded (1 of 2)]
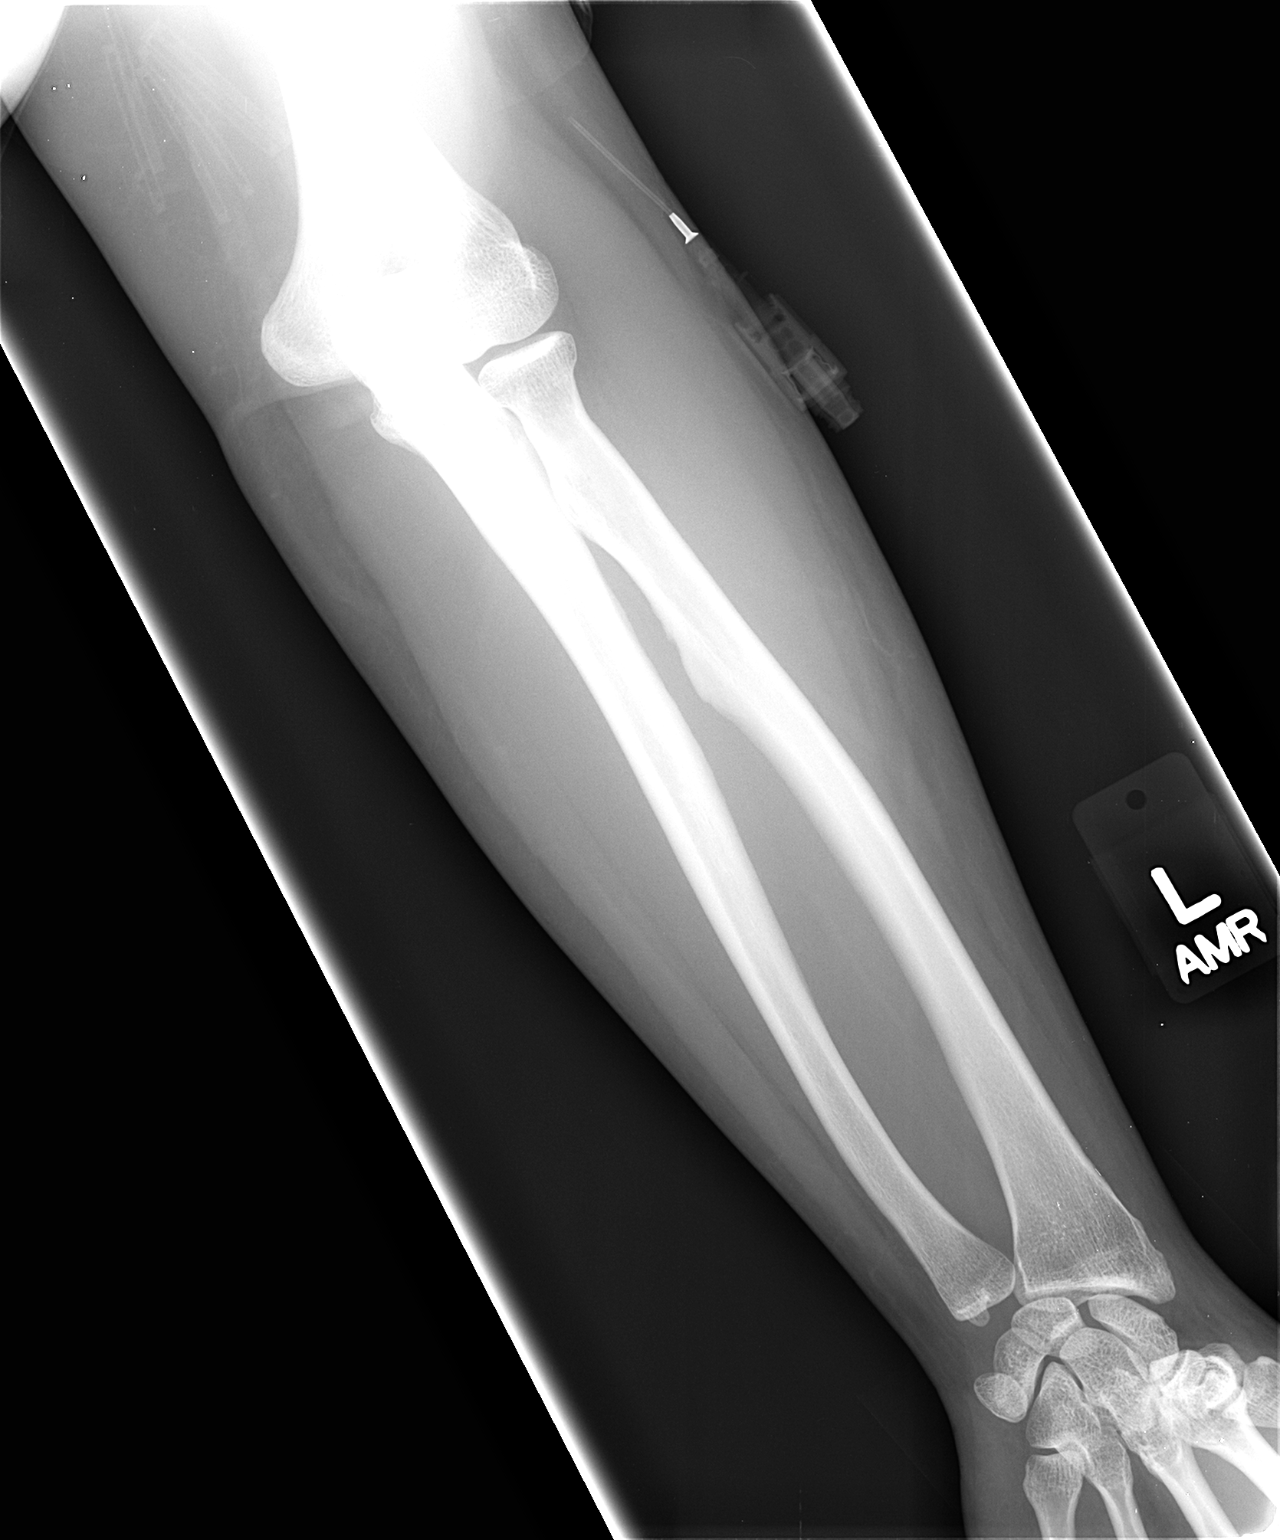

[view not recorded (2 of 2)]
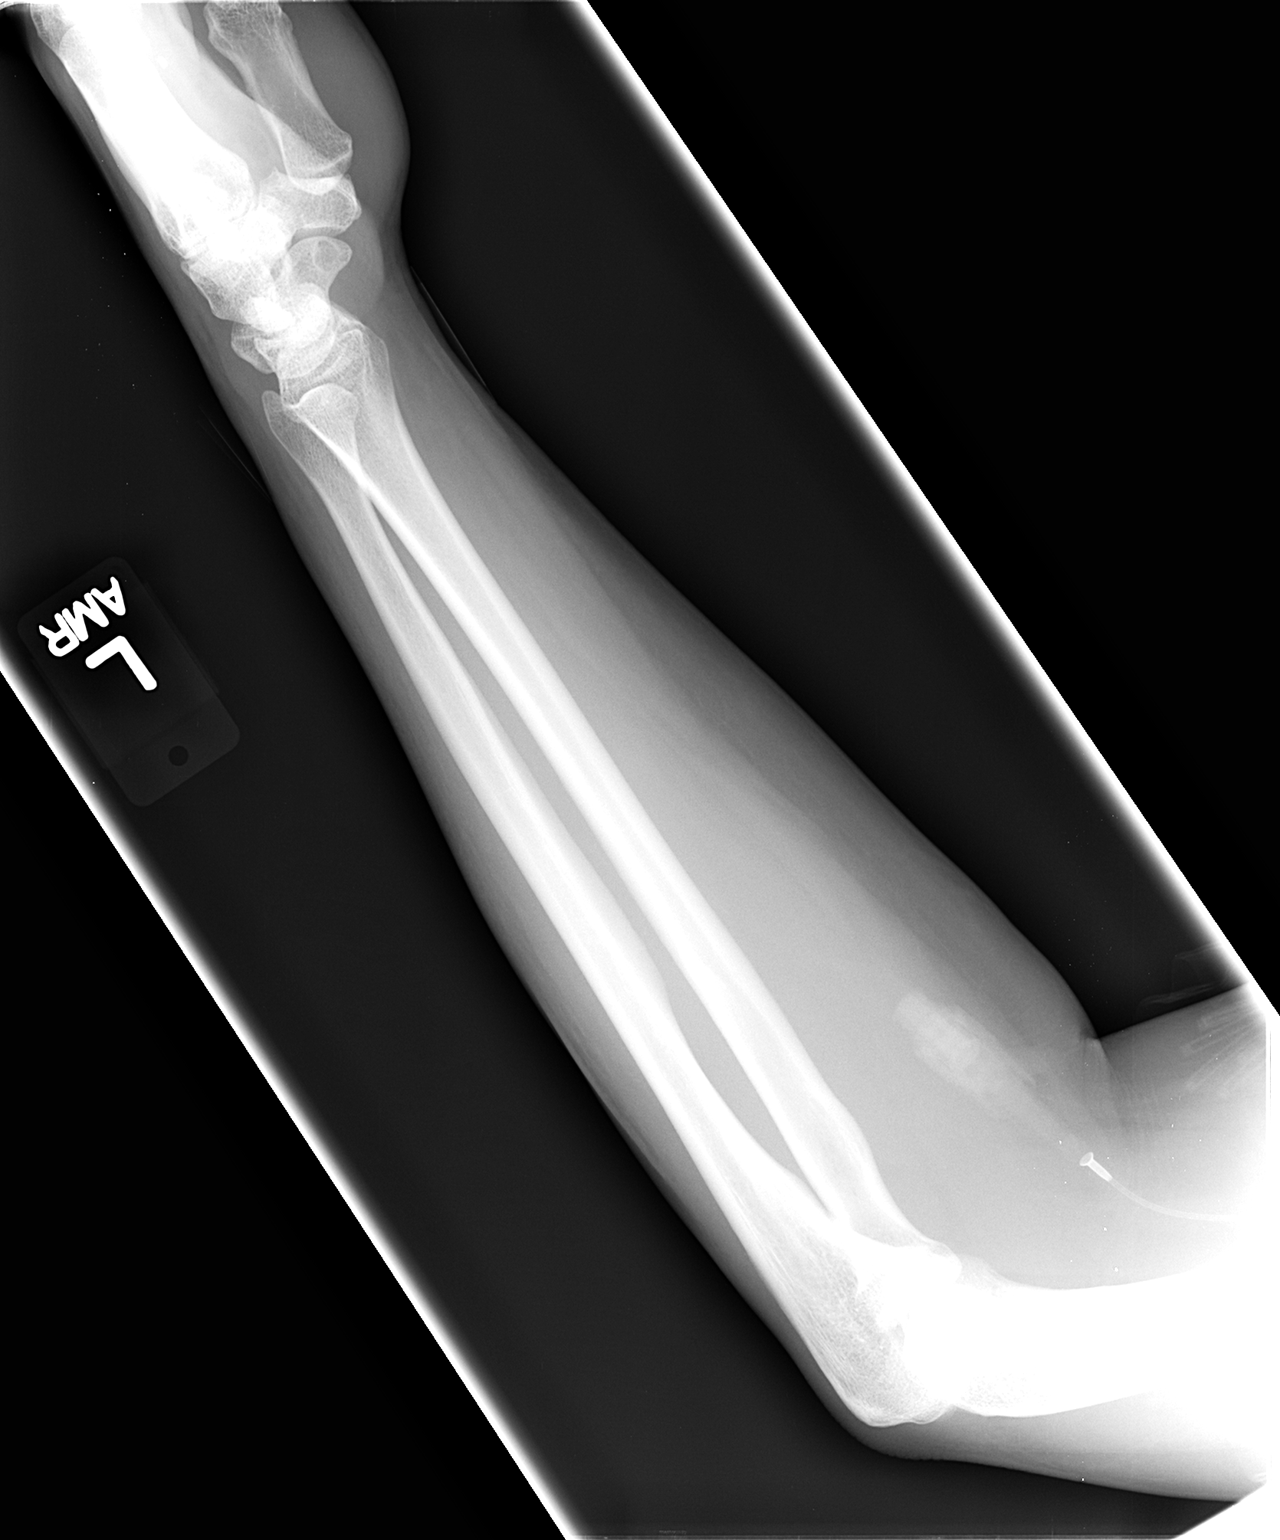

[2 of 2 positions shown; findings below may reference images not displayed]

FINDINGS: Two views of the left forearm submitted.  No acute
fracture or subluxation.  No radiopaque foreign body.
IMPRESSION: No acute fracture or subluxation.

## 2014-07-28 IMAGING — CT CT CERVICAL SPINE W/O CM
3 of 5 series · 9 of 33 positions shown, 11 images · non-contrast
Comparison: 02/23/2010

CLINICAL DATA: Fall, head injury

CT HEAD WITHOUT CONTRAST,CT CERVICAL SPINE WITHOUT CONTRAST
TECHNIQUE: Contiguous axial images were obtained from the base of
the skull through the vertex without contrast.,Technique:
Multidetector CT imaging of the cervical spine was performed.
Multiplanar CT image reconstructions were also generated.

[Series 5: cervical st 2.0 b31s · axial · 0.30mm/px · z∈[+212,+212]mm · 1 of 74 slices shown, 2 images]
[im 44/74  soft-tissue]
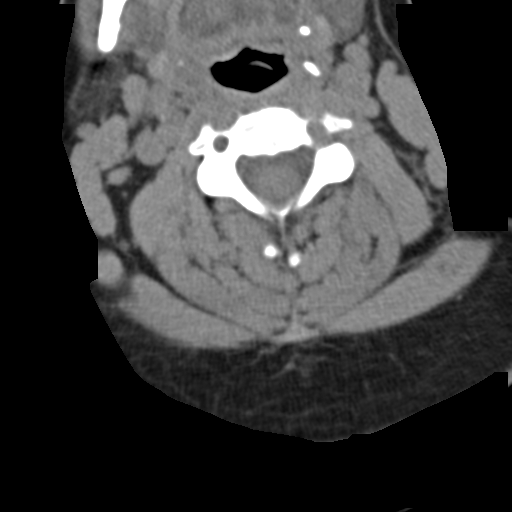
[im 44/74  bone]
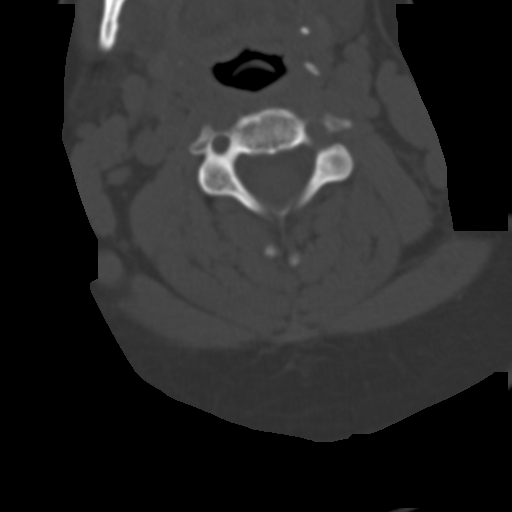

[Series 7: sagittal bone 2.0 · sagittal · 0.19mm/px · 5 of 45 slices shown, 6 images]
[im 15/45  bone]
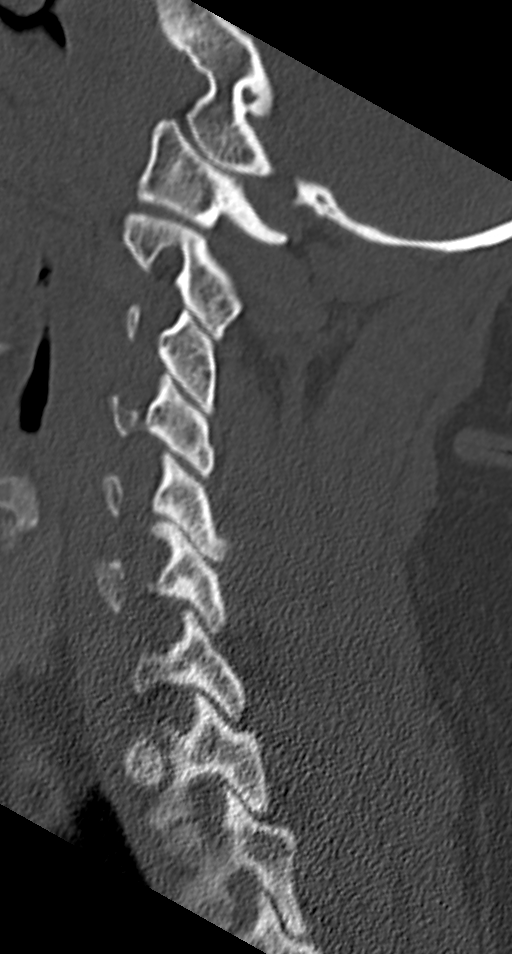
[im 19/45  bone]
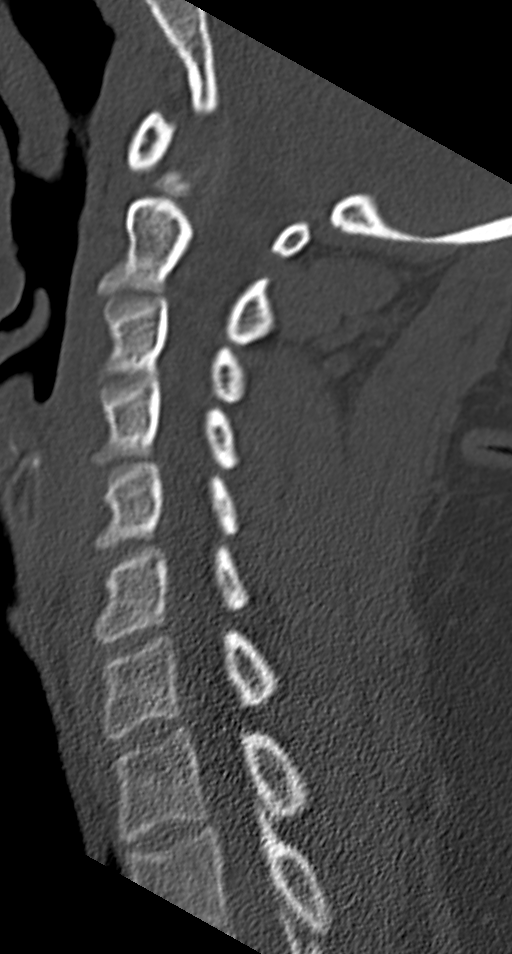
[im 23/45  soft-tissue]
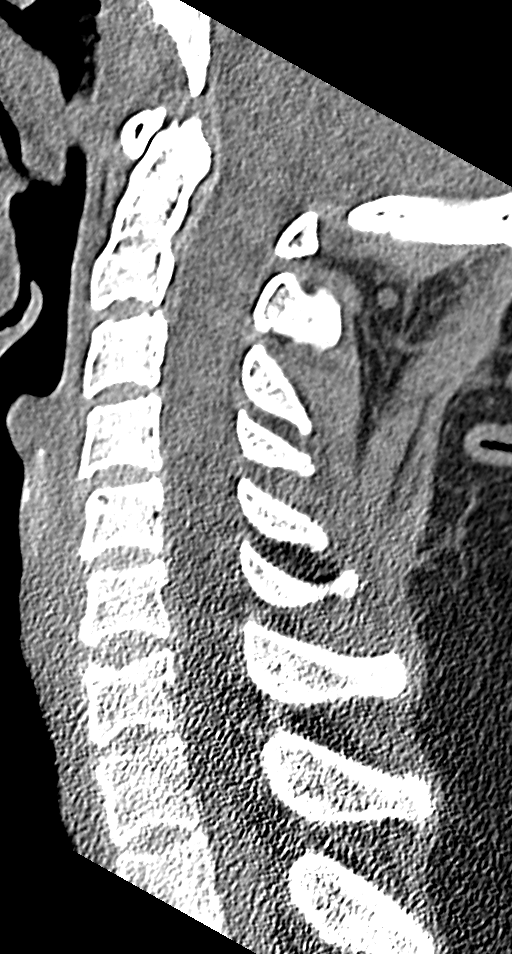
[im 23/45  bone]
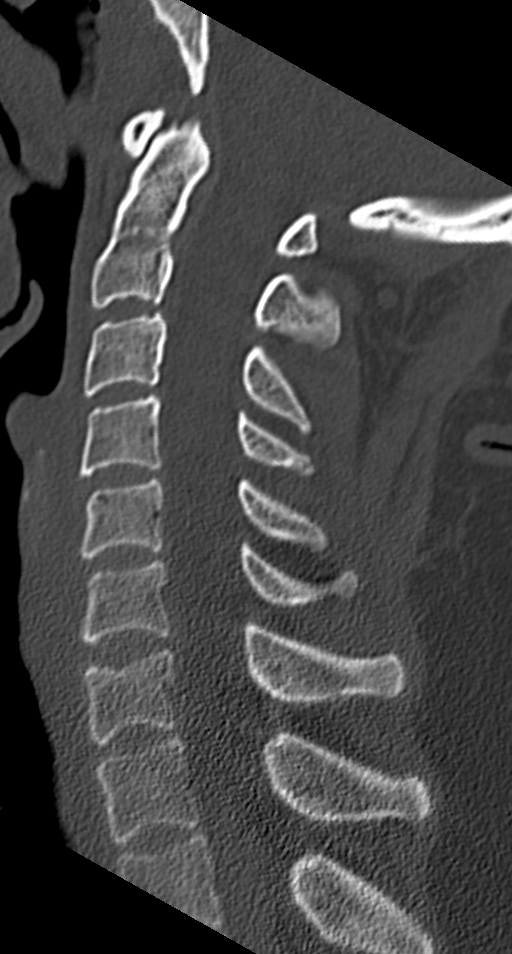
[im 26/45  bone]
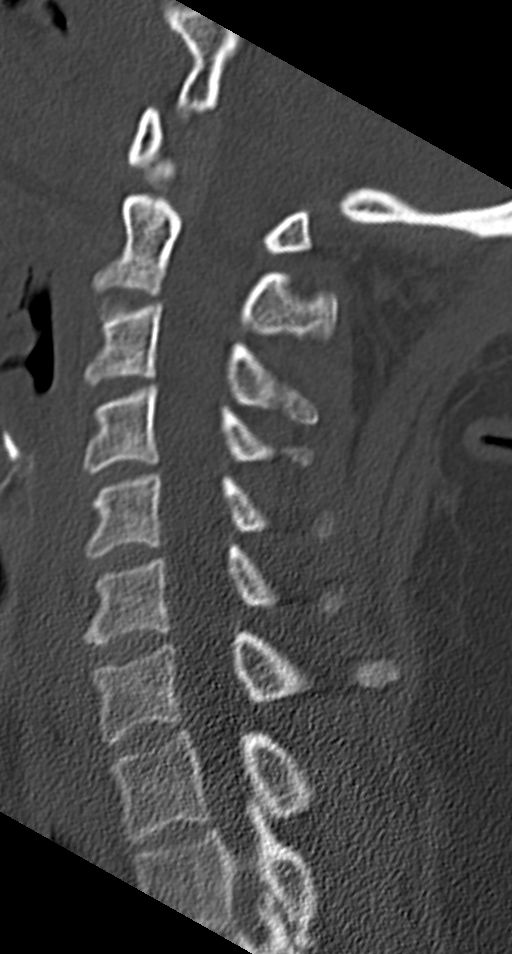
[im 30/45  bone]
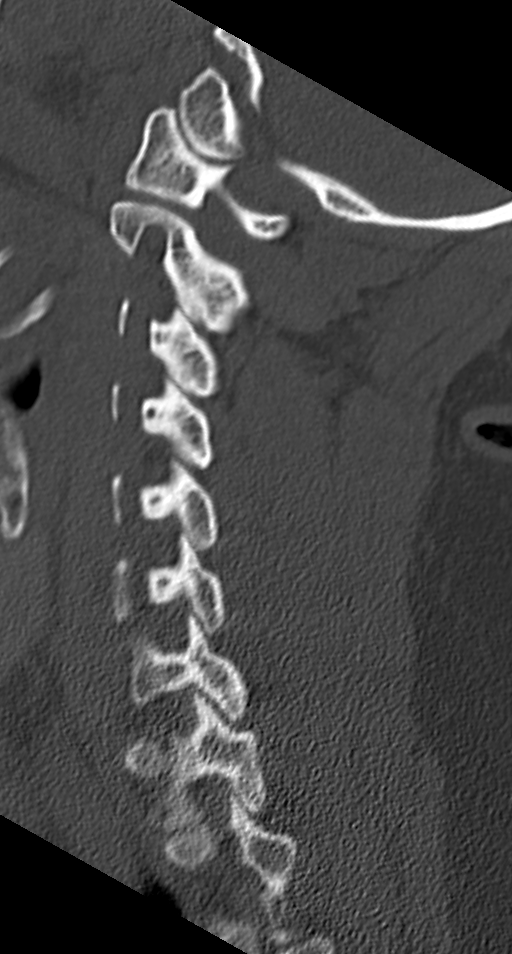

[Series 8: coronal bone 2.0 · coronal · 0.17mm/px · 3 of 52 slices shown]
[im 11/52  bone]
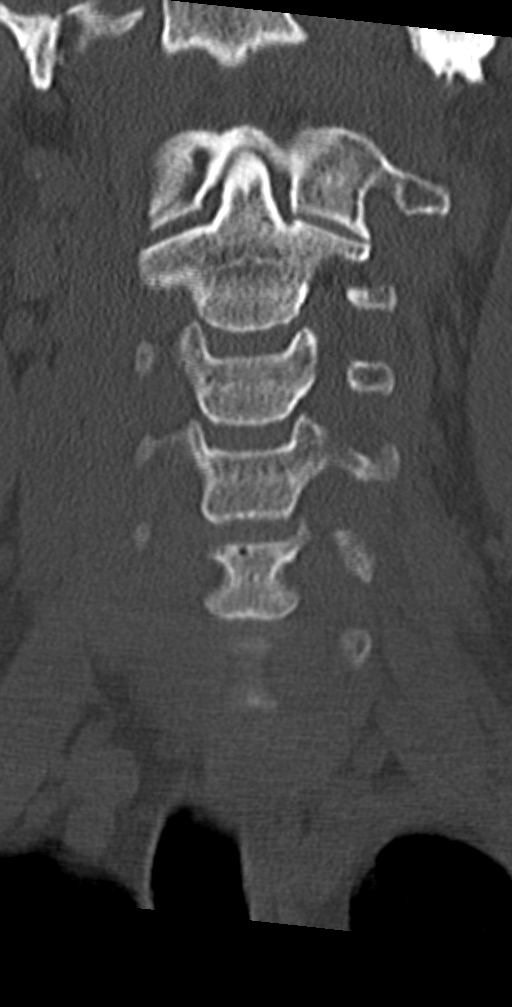
[im 21/52  bone]
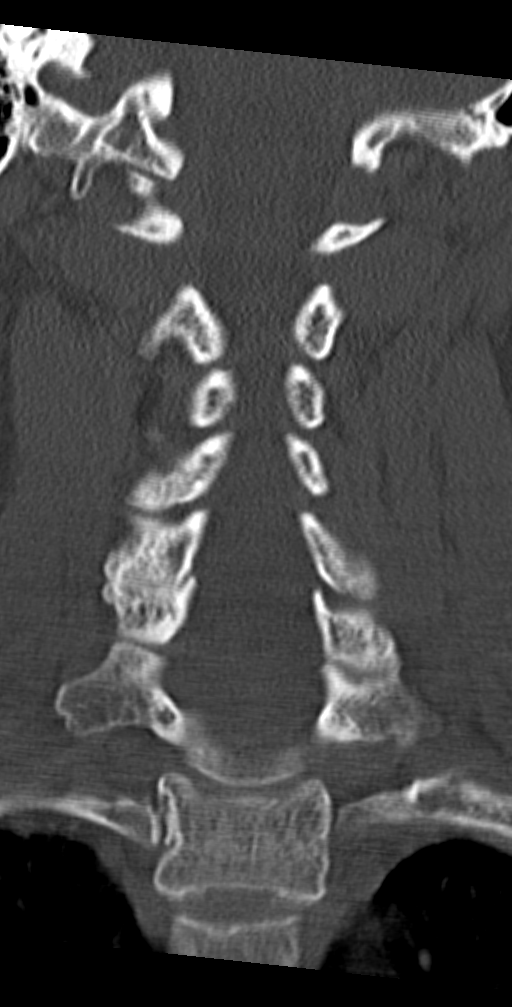
[im 31/52  bone]
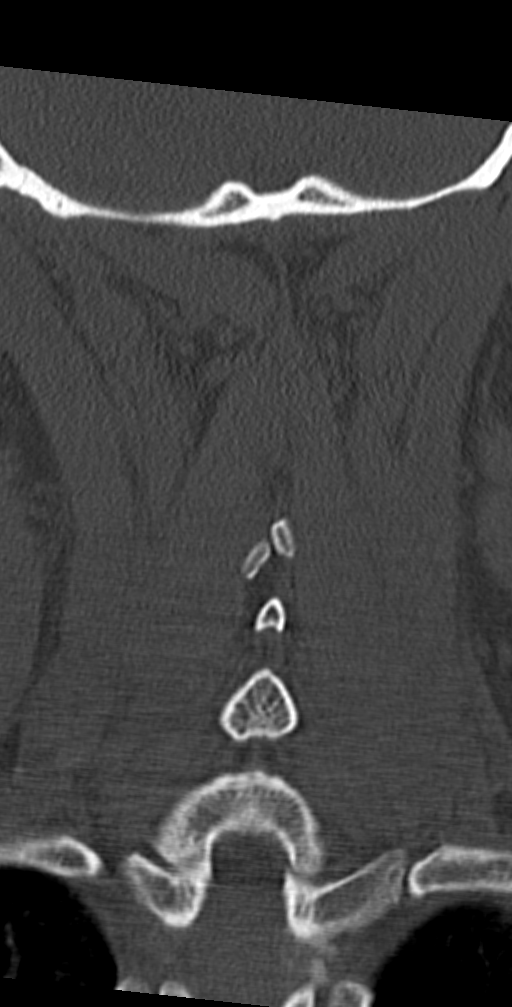

[9 of 33 positions shown; findings below may reference images not displayed]

FINDINGS: No skull fracture is noted.  There is scalp swelling and
probable small subgaleal hematoma in the right parietal region high
convexity.
Best seen in axial image 27.  Clinical correlation is necessary.

No intracranial hemorrhage, mass effect or midline shift.
Paranasal sinuses and mastoid air cells are unremarkable.  No acute
cortical infarction.  No mass lesion is noted on this unenhanced
scan.  No intraventricular hemorrhage.
IMPRESSION: No acute intracranial abnormality.  Scalp swelling subcutaneous
stranding probable small subgaleal hematoma in the right parietal
region high convexity best seen on axial image 27.  Clinical
correlation is necessary.

CT cervical spine without IV contrast:

Axial images of the cervical spine shows no acute fracture or
subluxation.

Computer processed images shows no acute fracture or subluxation.
There is no pneumothorax in visualized lung apices.

The alignment, disc spaces and vertebral height are preserved.  No
prevertebral soft tissue swelling.  Cervical airway is patent.
IMPRESSION: 1.  No acute fracture or subluxation.

## 2014-07-28 IMAGING — CR DG THORACIC SPINE 3V
3 series · 3 of 3 positions shown · non-contrast
Comparison: None.

CLINICAL DATA: Fall, back pain

THORACIC SPINE - 2 VIEW + SWIMMERS

[view not recorded (1 of 3)]
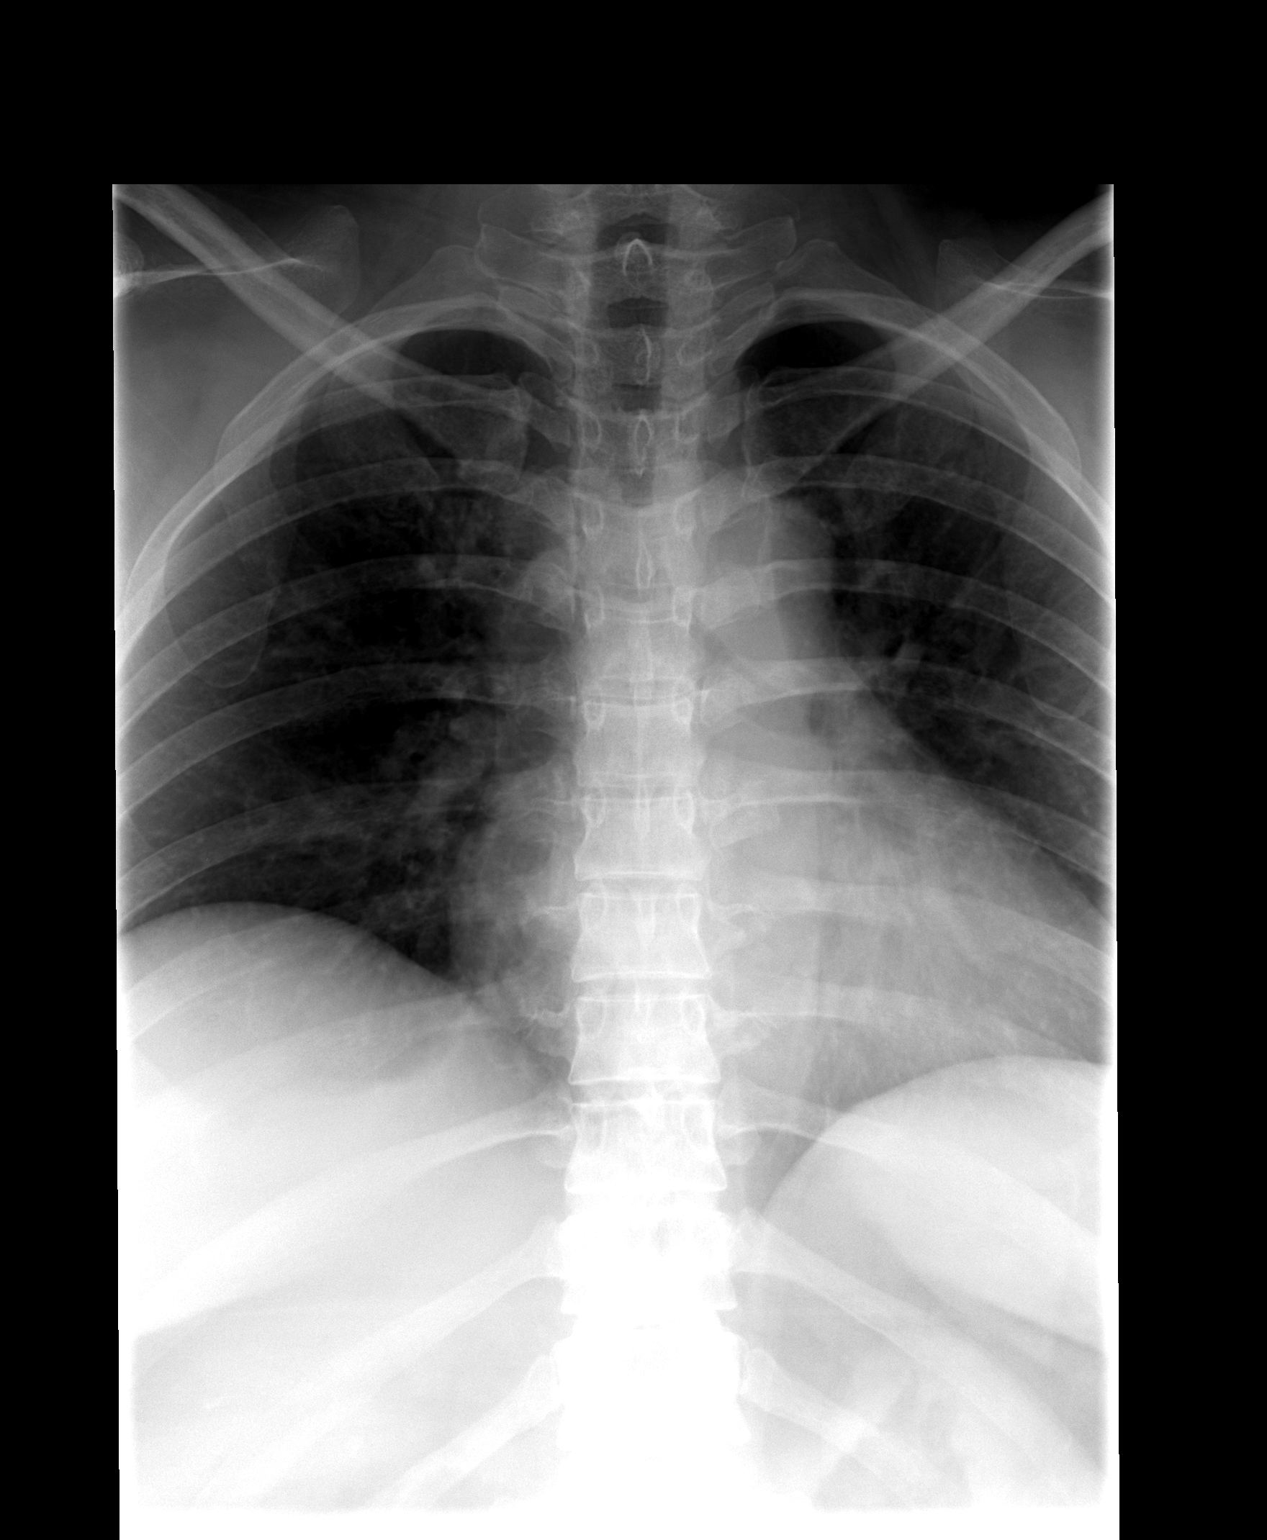

[view not recorded (2 of 3)]
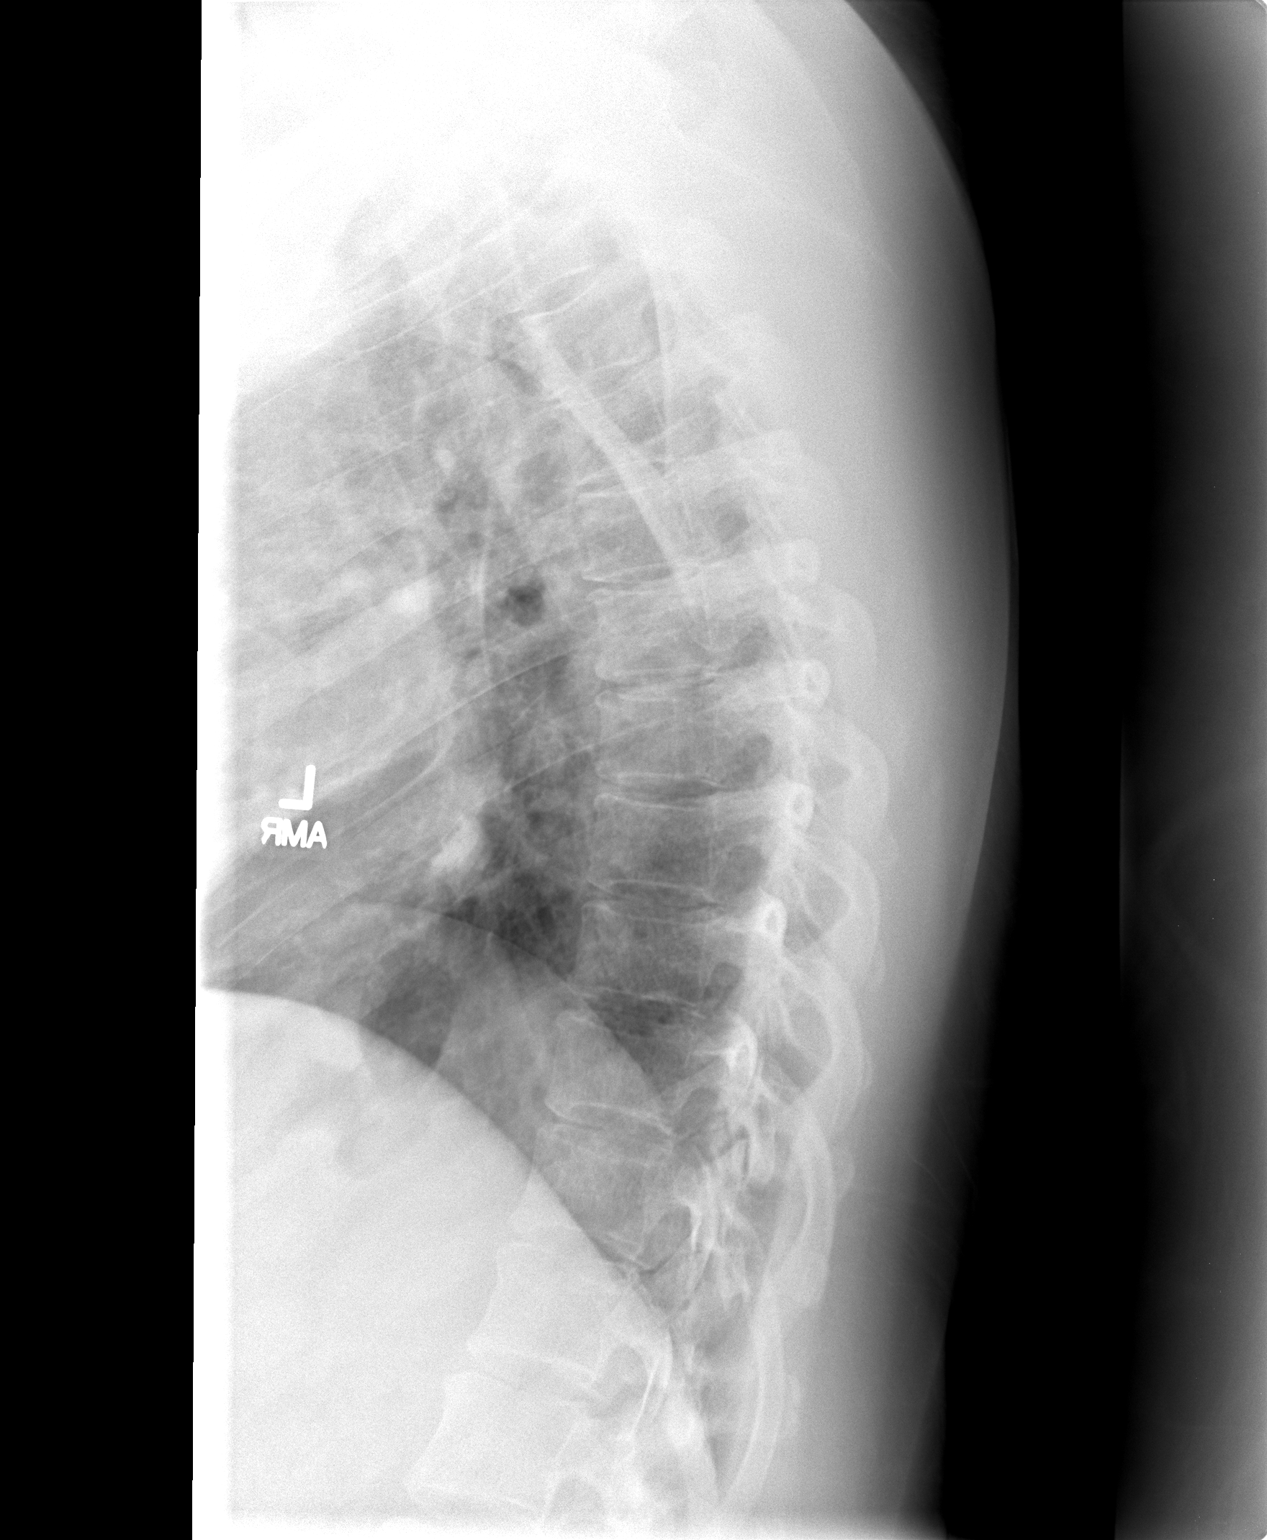

[view not recorded (3 of 3)]
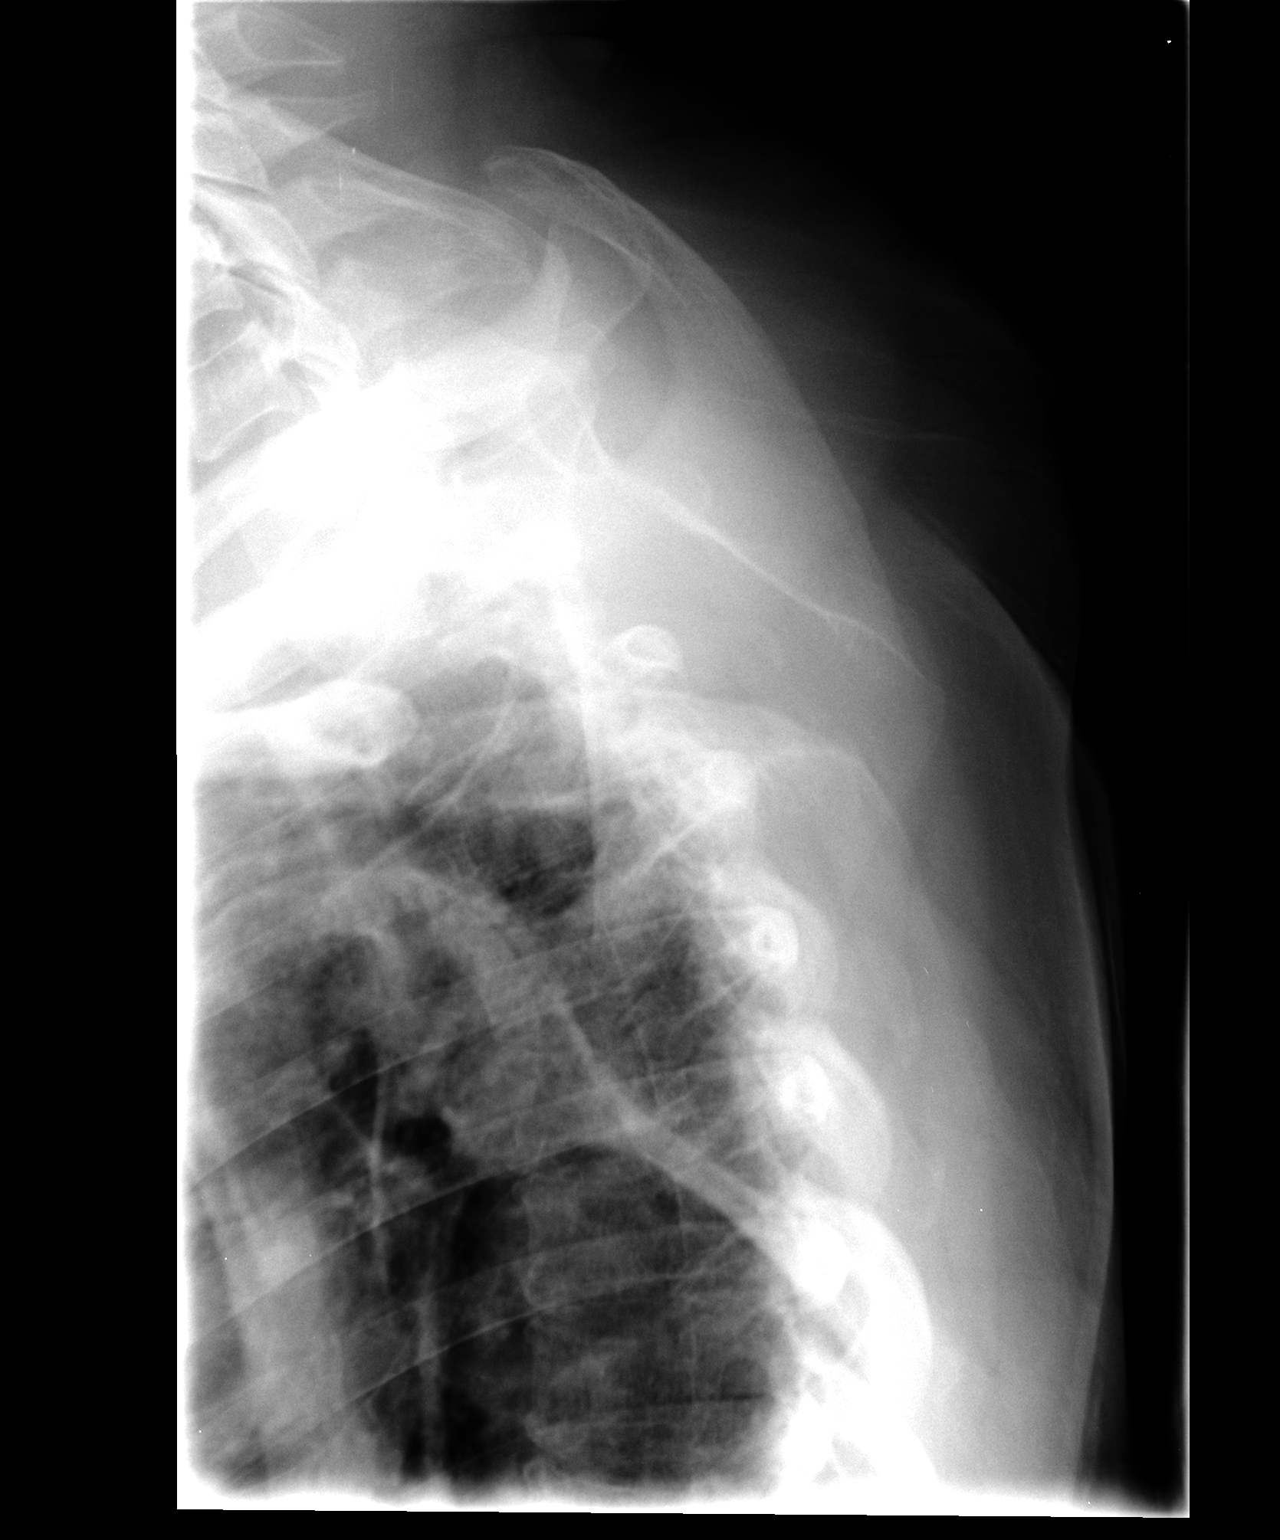

[3 of 3 positions shown; findings below may reference images not displayed]

FINDINGS: Three views of thoracic spine submitted.  No acute
fracture or subluxation.  Alignment, disc spaces and vertebral
height are preserved.
IMPRESSION: No acute fracture or subluxation.

## 2014-08-13 ENCOUNTER — Emergency Department (HOSPITAL_COMMUNITY)
Admission: EM | Admit: 2014-08-13 | Discharge: 2014-08-13 | Disposition: A | Discharge status: Home / Self Care | Payer: Self-pay | Attending: Emergency Medicine | Admitting: Emergency Medicine

## 2014-08-13 ENCOUNTER — Encounter (HOSPITAL_COMMUNITY): Payer: Self-pay | Admitting: *Deleted

## 2014-08-13 DIAGNOSIS — Z8739 Personal history of other diseases of the musculoskeletal system and connective tissue: Secondary | ICD-10-CM | POA: Insufficient documentation

## 2014-08-13 DIAGNOSIS — Z8614 Personal history of Methicillin resistant Staphylococcus aureus infection: Secondary | ICD-10-CM | POA: Insufficient documentation

## 2014-08-13 DIAGNOSIS — L02211 Cutaneous abscess of abdominal wall: Secondary | ICD-10-CM | POA: Insufficient documentation

## 2014-08-13 DIAGNOSIS — Z91040 Latex allergy status: Secondary | ICD-10-CM | POA: Insufficient documentation

## 2014-08-13 DIAGNOSIS — I10 Essential (primary) hypertension: Secondary | ICD-10-CM | POA: Insufficient documentation

## 2014-08-13 DIAGNOSIS — Z79899 Other long term (current) drug therapy: Secondary | ICD-10-CM | POA: Insufficient documentation

## 2014-08-13 DIAGNOSIS — Z87448 Personal history of other diseases of urinary system: Secondary | ICD-10-CM | POA: Insufficient documentation

## 2014-08-13 MED ORDER — CLINDAMYCIN HCL 150 MG PO CAPS: 150.0000 mg | ORAL_CAPSULE | Freq: Four times a day (QID) | ORAL | Status: DC

## 2014-08-13 MED ORDER — DICLOFENAC SODIUM 50 MG PO TBEC: 50.0000 mg | DELAYED_RELEASE_TABLET | Freq: Two times a day (BID) | ORAL | Status: DC

## 2014-08-13 NOTE — ED Notes (Signed)
Pt says "MRSA out break" on RLQ  X 4 days

## 2014-08-13 NOTE — Discharge Instructions (Signed)
Take the antibiotics as directed and follow up with your doctor.   Abscess An abscess (boil or furuncle) is an infected area on or under the skin. This area is filled with yellowish-white fluid (pus) and other material (debris). HOME CARE   Only take medicines as told by your doctor.  If you were given antibiotic medicine, take it as directed. Finish the medicine even if you start to feel better.  If gauze is used, follow your doctor's directions for changing the gauze.  To avoid spreading the infection:  Keep your abscess covered with a bandage.  Wash your hands well.  Do not share personal care items, towels, or whirlpools with others.  Avoid skin contact with others.  Keep your skin and clothes clean around the abscess.  Keep all doctor visits as told. GET HELP RIGHT AWAY IF:   You have more pain, puffiness (swelling), or redness in the wound site.  You have more fluid or blood coming from the wound site.  You have muscle aches, chills, or you feel sick.  You have a fever. MAKE SURE YOU:   Understand these instructions.  Will watch your condition.  Will get help right away if you are not doing well or get worse. Document Released: 11/02/2007 Document Revised: 11/15/2011 Document Reviewed: 07/29/2011 [facility] Patient Information 2015 [place], [place]. This information is not intended to replace advice given to you by your health care provider. Make sure you discuss any questions you have with your health care provider.

## 2014-08-13 NOTE — ED Provider Notes (Signed)
CSN: [id number]     Arrival date & time 08/13/14  1121 History   First MD Initiated Contact with Patient 08/13/14 1158     No chief complaint on file.    (Consider location/radiation/quality/duration/timing/severity/associated sxs/prior Treatment) HPI .nameis a 44 y.o. female who presents to the ED for an area to the right lower abdomen that has gotten infected. She states that it started out as a bump. She mashed it and now it is larger and has redness. She has a hx of MRSA and has had to be hospitalized in the past. She states that now when she gets a place that looks like it may get infected she tries to get it checked early. She states that her doctor has to give her Clindamycin to the it taken care of. She denies fever or chills or other problems.  Past Medical History  Diagnosis Date  . Gout   . Migraine   . MRSA infection   . Hypertension   . Kidney infection    Past Surgical History  Procedure Laterality Date  . Left leg      MRSA   History reviewed. No pertinent family history. History  Substance Use Topics  . Smoking status: Never Smoker   . Smokeless tobacco: Not on file  . Alcohol Use: No     Comment: once a year   OB History    No data available     Review of Systems Negative except as stated in HPI  Allergies  Codeine; Other; Peach; Strawberry; Toradol; Tramadol; and Latex  Home Medications   Prior to Admission medications   Medication Sig Start Date End Date Taking? Authorizing Provider  lisinopril (PRINIVIL,ZESTRIL) 20 MG tablet Take 20 mg by mouth daily.   Yes Historical Provider, MD  potassium chloride SA (K-DUR,KLOR-CON) 20 MEQ tablet Take 20 mEq by mouth daily.    Yes Historical Provider, MD  clindamycin (CLEOCIN) 150 MG capsule Take 1 capsule (150 mg total) by mouth every 6 (six) hours. 08/13/14   Hope Bunnie Pion, NP  diclofenac (VOLTAREN) 50 MG EC tablet Take 1 tablet (50 mg total) by mouth 2 (two) times daily. 08/13/14   Hope Bunnie Pion, NP   BP 127/87  mmHg  Pulse 80  Temp(Src) 98.1 F (36.7 C) (Oral)  Resp 20  Ht 5\' 1"  (1.549 m)  Wt 161 lb (73.029 kg)  BMI 30.44 kg/m2  SpO2 100%  LMP 08/10/2014 Physical Exam  Constitutional: She is oriented to person, place, and time. She appears well-developed and well-nourished.  HENT:  Head: Normocephalic.  Eyes: EOM are normal.  Neck: Neck supple.  Cardiovascular: Normal rate.   Pulmonary/Chest: Effort normal.  Abdominal: Soft.    1 cm raised red tender area to right lower abdomen.   Musculoskeletal: Normal range of motion.  Neurological: She is alert and oriented to person, place, and time. No cranial nerve deficit.  Skin: Skin is warm and dry.  Psychiatric: She has a normal mood and affect. Her behavior is normal.  Nursing note and vitals reviewed.   ED Course  Procedures  MDM  44 y.o. female with hx of MRSA and tender red area to abdomen. Will treat with antibiotics. Discussed with the patient and all questioned fully answered. She will return if any problems arise.  Final diagnoses:  Abscess of skin of abdomen       Ashley Murrain, NP 08/15/14 1811  Davonna Belling, MD 08/18/14 [phone]

## 2014-08-13 NOTE — ED Notes (Signed)
Area was covered with 4x4 and tape per pt and NP request. No drainage noted.

## 2014-08-13 NOTE — ED Notes (Signed)
NP at bedside.

## 2014-08-14 NOTE — ED Notes (Signed)
RN received call from [facility] in [place] requesting prescription change d/t cost. Cleocin d/c'd. Bactrim DS 1 tablet po twice a day for 7 days ordered and called into [facility].

## 2015-01-15 ENCOUNTER — Other Ambulatory Visit (HOSPITAL_COMMUNITY): Payer: Self-pay | Admitting: Nurse Practitioner

## 2015-01-15 DIAGNOSIS — Z1231 Encounter for screening mammogram for malignant neoplasm of breast: Secondary | ICD-10-CM

## 2015-01-22 ENCOUNTER — Ambulatory Visit (HOSPITAL_COMMUNITY)
Admission: RE | Admit: 2015-01-22 | Discharge: 2015-01-22 | Disposition: A | Discharge status: Home / Self Care | Payer: Self-pay | Source: Ambulatory Visit | Attending: Nurse Practitioner | Admitting: Nurse Practitioner

## 2015-01-22 DIAGNOSIS — Z1231 Encounter for screening mammogram for malignant neoplasm of breast: Secondary | ICD-10-CM

## 2015-05-05 DIAGNOSIS — I10 Essential (primary) hypertension: Secondary | ICD-10-CM

## 2015-05-20 DIAGNOSIS — I10 Essential (primary) hypertension: Secondary | ICD-10-CM

## 2015-06-17 DIAGNOSIS — Z139 Encounter for screening, unspecified: Secondary | ICD-10-CM

## 2015-08-05 DIAGNOSIS — Z139 Encounter for screening, unspecified: Secondary | ICD-10-CM

## 2015-11-24 DIAGNOSIS — Z139 Encounter for screening, unspecified: Secondary | ICD-10-CM

## 2015-12-21 ENCOUNTER — Other Ambulatory Visit (HOSPITAL_COMMUNITY): Payer: Self-pay | Admitting: Nurse Practitioner

## 2015-12-21 DIAGNOSIS — N852 Hypertrophy of uterus: Secondary | ICD-10-CM

## 2016-01-04 NOTE — Congregational Nurse Program (Signed)
Congregational Nurse Program Note  Date of Encounter: 05/05/2015  Past Medical History: Past Medical History:  Diagnosis Date   Gout    Hypertension    Kidney infection    Migraine    MRSA infection     Encounter Details:   Requested appointment at [facility] for family planning . Referred May 08, 2015 at 1:00pm

## 2016-01-04 NOTE — Patient Instructions (Signed)
Call Nurse after visit to [facility] GYN

## 2016-01-04 NOTE — Congregational Nurse Program (Signed)
Congregational Nurse Program Note  Date of Encounter: 05/20/2015  Past Medical History: Past Medical History:  Diagnosis Date   Gout    Hypertension    Kidney infection    Migraine    MRSA infection     Encounter Details:  Follow-up visit there were changes made in medication refereed to [facility] GYN at [facility] hospital

## 2016-01-05 ENCOUNTER — Ambulatory Visit (HOSPITAL_COMMUNITY): Admission: RE | Admit: 2016-01-05 | Payer: Self-pay | Source: Ambulatory Visit

## 2016-01-21 NOTE — Congregational Nurse Program (Signed)
Congregational Nurse Program Note  Date of Encounter: 06/17/2015  Past Medical History: Past Medical History:  Diagnosis Date   Gout    Hypertension    Kidney infection    Migraine    MRSA infection     Encounter Details:     CNP Questionnaire - 01/21/16 1509      Patient Demographics   Is this a new or existing patient? New   Patient is considered a/an Not Applicable   Race Caucasian/White     Patient Assistance   Location of Patient Assistance [place]   Patient's financial/insurance status Self-Pay   Uninsured Patient Yes   Interventions Assisted patient in making appt.   Patient referred to apply for the following financial assistance Not Applicable   Food insecurities addressed Provided food supplies   Transportation assistance No   Assistance securing medications No   Educational health offerings Health literacy;Navigating the healthcare system     Encounter Details   Primary purpose of visit Education/Health Concerns;Other;Safety   Was an Emergency Department visit averted? No   Does patient have a medical provider? Yes   Patient referred to Follow up with established PCP   Was a mental health screening completed? (GAINS tool) No   Does patient have dental issues? No   Does patient have vision issues? No   Does your patient have an abnormal blood pressure today? Yes   Since previous encounter, have you referred patient for abnormal blood pressure that resulted in a new diagnosis or medication change? No   Does your patient have an abnormal blood glucose today? No   Since previous encounter, have you referred patient for abnormal blood glucose that resulted in a new diagnosis or medication change? No   Was there a life-saving intervention made? No      Clients missed scheduled appointment, complaints of rt leg swelling, frequent coughs, needed assistance with BP medication. Appointment scheduled July 16, 2015 at 3:30 pm at [facility].Norman Clay, RN [phone]

## 2016-01-22 ENCOUNTER — Ambulatory Visit (HOSPITAL_COMMUNITY): Admission: RE | Admit: 2016-01-22 | Payer: Self-pay | Source: Ambulatory Visit

## 2016-01-26 NOTE — Congregational Nurse Program (Signed)
Congregational Nurse Program Note  Date of Encounter: 11/24/2015  Past Medical History: Past Medical History:  Diagnosis Date  . Gout   . Hypertension   . Kidney infection   . Migraine   . MRSA infection     Encounter Details:  Client was seen at the [facility] with complaints of urinary symptoms. Appointment schedule with estalbished PCP at [place] 12/04/15 3:30.  Charlotte Sanes CN  [phone]

## 2016-01-27 NOTE — Congregational Nurse Program (Signed)
Congregational Nurse Program Note  Date of Encounter: 08/05/2015  Past Medical History: Past Medical History:  Diagnosis Date   Gout    Hypertension    Kidney infection    Migraine    MRSA infection     Encounter Details: Client missed scheduled appointments with [facility], assisted with new appointment scheduled August 31, 2015 at 4:00pm, with [facility], no transportation needed. Alta Corning, RN(Patricia Tawny [place], [place] [facility] [phone]

## 2016-02-08 NOTE — Progress Notes (Unsigned)
This encounter was created in error - please disregard.

## 2016-04-28 NOTE — Congregational Nurse Program (Signed)
Congregational Nurse Program Note  Date of Encounter: 04/20/2016  Past Medical History: Past Medical History:  Diagnosis Date  . Gout   . Hypertension   . Kidney infection   . Migraine   . MRSA infection     Encounter Details:     CNP Questionnaire - 04/20/16 1102      Patient Demographics   Is this a new or existing patient? New   Patient is considered a/an Not Applicable   Race Caucasian/White     Patient Assistance   Location of Patient [place]   Uninsured Patient ([place]) Yes   Patient referred to apply for the following financial assistance Not Applicable   Food insecurities addressed Provided food supplies   Transportation assistance No   Assistance securing medications No   Educational health offerings Health literacy;Navigating the healthcare system     Encounter Details   Primary purpose of visit Education/Health Concerns;Other;Safety   Was an Emergency Department visit averted? No   Does patient have a medical provider? Yes   Patient referred to Follow up with established PCP   Was a mental health screening completed? (GAINS tool) No   Does patient have dental issues? No   Does patient have vision issues? No   Does your patient have an abnormal blood pressure today? Yes   Since previous encounter, have you referred patient for abnormal blood pressure that resulted in a new diagnosis or medication change? No   Does your patient have an abnormal blood glucose today? No   Since previous encounter, have you referred patient for abnormal blood glucose that resulted in a new diagnosis or medication change? No   Was there a life-saving intervention made? No     Bp- 134/83 P- [place]

## 2016-10-03 NOTE — Congregational Nurse Program (Signed)
Congregational Nurse Program Note  Date of Encounter: 10/03/2016  Past Medical History: Past Medical History:  Diagnosis Date  . Gout   . Hypertension   . Kidney infection   . Migraine   . MRSA infection     Encounter Details:     CNP Questionnaire - 10/03/16 1505      Patient Demographics   Is this a new or existing patient? Existing   Patient is considered a/an Not Applicable   Race Caucasian/White     Patient Assistance   Location of Patient [place]   Patient's financial/insurance status Self-Pay (Uninsured)   Uninsured Patient ([place] [employer]) No   Patient referred to apply for the following financial assistance Not Applicable   Food insecurities addressed Not Applicable   Transportation assistance No   Assistance securing medications No   Educational health offerings Hypertension;Other     Encounter Details   Primary purpose of visit Chronic Illness/Condition Visit   Was an Emergency Department visit averted? No   Does patient have a medical provider? Yes   Patient referred to Follow up with established PCP   Was a mental health screening completed? (GAINS tool) No   Does patient have dental issues? No   Does patient have vision issues? No   Does your patient have an abnormal blood pressure today? Yes   Since previous encounter, have you referred patient for abnormal blood pressure that resulted in a new diagnosis or medication change? No   Does your patient have an abnormal blood glucose today? No   Since previous encounter, have you referred patient for abnormal blood glucose that resulted in a new diagnosis or medication change? No   Was there a life-saving intervention made? No     Client was seen for BP check, history of hypertension and is on medication. Client has established PCP at [facility]. Client expressed currently under a stressful situation and can contribute to hypertension. Encouraged client to follow up with PCP for  followup on increasesd BP. Encouraged to seek out resource to help with stressful situation. Tarri Fuller  [phone].

## 2016-10-31 NOTE — Congregational Nurse Program (Signed)
Congregational Nurse Program Note  Date of Encounter: 10/31/2016  Past Medical History: Past Medical History:  Diagnosis Date  . Gout   . Hypertension   . Kidney infection   . Migraine   . MRSA infection     Encounter Details:     CNP Questionnaire - 10/31/16 1100      Patient Demographics   Is this a new or existing patient? Existing   Patient is considered a/an Not Applicable   Race Caucasian/White     Patient Assistance   Location of Patient [place]   Patient's financial/insurance status Self-Pay (Uninsured)   Uninsured Patient ([place] [employer]) No   Patient referred to apply for the following financial assistance Not Applicable   Food insecurities addressed Provided food supplies   Transportation assistance Yes   Type of Assistance [facility]   Assistance securing medications No   Educational health offerings Hypertension     Encounter Details   Primary purpose of visit Chronic Illness/Condition Visit   Was an Emergency Department visit averted? No   Does patient have a medical provider? Yes   Patient referred to Follow up with established PCP   Was a mental health screening completed? (GAINS tool) No   Does patient have dental issues? Yes   Was a dental referral made? Yes   Does patient have vision issues? No   Does your patient have an abnormal blood pressure today? Yes   Since previous encounter, have you referred patient for abnormal blood pressure that resulted in a new diagnosis or medication change? No   Does your patient have an abnormal blood glucose today? No   Since previous encounter, have you referred patient for abnormal blood glucose that resulted in a new diagnosis or medication change? No   Was there a life-saving intervention made? No     Client was assisted with appointment with established PCP at [facility] Appointment for a previous follow up scheduled for 6/22/1/8 8:00 am. Transportation arranged with [facility].  BP today is  abnormal(see vitals). Client states she is going through a stress time and is not getting adaquate sleep.  Incouraged client to find ways to reduce stress and get more rest and sleep.   Joanna Perry  [phone].

## 2016-12-15 NOTE — Congregational Nurse Program (Signed)
Congregational Nurse Program Note  Date of Encounter: 12/15/2016  Past Medical History: Past Medical History:  Diagnosis Date  . Gout   . Hypertension   . Kidney infection   . Migraine   . MRSA infection     Encounter Details:     CNP Questionnaire - 12/15/16 1053      Patient Demographics   Is this a new or existing patient? Existing   Patient is considered a/an Not Applicable   Race Caucasian/White     Patient Assistance   Location of Patient [place]   Patient's financial/insurance status Self-Pay (Uninsured)   Uninsured Patient ([place] [employer]) No   Patient referred to apply for the following financial assistance Not Applicable   Food insecurities addressed Not Applicable   Transportation assistance Yes   Type of Assistance [facility]   Assistance securing medications No   Educational health offerings Navigating the healthcare system;Other     Encounter Details   Primary purpose of visit Other   Was an Emergency Department visit averted? Not Applicable   Does patient have a medical provider? Yes   Patient referred to Follow up with established PCP   Was a mental health screening completed? (GAINS tool) No   Does patient have dental issues? No   Does patient have vision issues? No   Does your patient have an abnormal blood pressure today? No   Since previous encounter, have you referred patient for abnormal blood pressure that resulted in a new diagnosis or medication change? No   Does your patient have an abnormal blood glucose today? No   Since previous encounter, have you referred patient for abnormal blood glucose that resulted in a new diagnosis or medication change? No   Was there a life-saving intervention made? No     Rescheduled appointment at [facility], client did not show up for appt.  Appt. 12/30/16 1:30 arranged transportation with [facility], client notified of appointment.  Mariam Dollar, RN  [phone].

## 2017-01-11 ENCOUNTER — Ambulatory Visit (INDEPENDENT_AMBULATORY_CARE_PROVIDER_SITE_OTHER): Payer: Self-pay | Admitting: Orthopaedic Surgery

## 2017-01-11 ENCOUNTER — Encounter (INDEPENDENT_AMBULATORY_CARE_PROVIDER_SITE_OTHER): Payer: Self-pay | Admitting: Orthopaedic Surgery

## 2017-01-11 VITALS — Ht 61.0 in | Wt 165.0 lb

## 2017-01-11 DIAGNOSIS — S5291XA Unspecified fracture of right forearm, initial encounter for closed fracture: Secondary | ICD-10-CM
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
## 2017-01-12 NOTE — Congregational Nurse Program (Signed)
Congregational Nurse Program Note  Date of Encounter: 01/11/2017  Past Medical History: Past Medical History:  Diagnosis Date  . Gout   . Hypertension   . Kidney infection   . Migraine   . MRSA infection     Encounter Details:     CNP Questionnaire - 01/11/17 1034      Patient Demographics   Is this a new or existing patient? Existing   Patient is considered a/an Not Applicable   Race Caucasian/White     Patient Assistance   Location of Patient [place]   Patient's financial/insurance status Self-Pay (Uninsured)   Uninsured Patient ([place] [employer]) No   Patient referred to apply for the following financial assistance Not Applicable   Food insecurities addressed Provided food supplies   Transportation assistance Yes   Type of Assistance [facility]   Assistance securing medications No   Educational health offerings Hypertension     Encounter Details   Primary purpose of visit Acute Illness/Condition Visit   Was an Emergency Department visit averted? Not Applicable   Does patient have a medical provider? Yes   Patient referred to Follow up with established PCP   Was a mental health screening completed? (GAINS tool) No   Does patient have dental issues? No   Does patient have vision issues? No   Does your patient have an abnormal blood pressure today? No   Since previous encounter, have you referred patient for abnormal blood pressure that resulted in a new diagnosis or medication change? No   Does your patient have an abnormal blood glucose today? No   Since previous encounter, have you referred patient for abnormal blood glucose that resulted in a new diagnosis or medication change? No   Was there a life-saving intervention made? No     Client was assisted with referral form to [facility] for appointment to followup on BP and medication refill for BP.  Client had went to ER for injury to  R. Arm and diagnosed with a broken arm and referred to an  orthopedic doctor. Client states BP has been been abnormal and needs to followup with PCP at [facility]. Client has history of hypertension.  Appointment was scheduled for Friday 01/14/17 at 1:30. Arranged transportatin with [facility]. Tarri Fuller  [phone].

## 2017-01-16 NOTE — Congregational Nurse Program (Signed)
Congregational Nurse Program Note  Date of Encounter: 01/16/2017  Past Medical History: Past Medical History:  Diagnosis Date  . Gout   . Hypertension   . Kidney infection   . Migraine   . MRSA infection     Encounter Details:     CNP Questionnaire - 01/16/17 1447      Patient Demographics   Is this a new or existing patient? Existing   Patient is considered a/an Not Applicable   Race Caucasian/White     Patient Assistance   Location of Patient [place]   Patient's financial/insurance status Self-Pay (Uninsured)   Uninsured Patient ([place] [employer]) No   Patient referred to apply for the following financial assistance Not Applicable   Food insecurities addressed Provided food supplies   Transportation assistance Yes   Type of Assistance [facility]   Assistance securing medications No   Educational health offerings Hypertension     Encounter Details   Primary purpose of visit Acute Illness/Condition Visit   Was an Emergency Department visit averted? Not Applicable   Does patient have a medical provider? Yes   Patient referred to Follow up with established PCP   Was a mental health screening completed? (GAINS tool) No   Does patient have dental issues? No   Does patient have vision issues? No   Does your patient have an abnormal blood pressure today? No   Since previous encounter, have you referred patient for abnormal blood pressure that resulted in a new diagnosis or medication change? No   Does your patient have an abnormal blood glucose today? No   Since previous encounter, have you referred patient for abnormal blood glucose that resulted in a new diagnosis or medication change? No   Was there a life-saving intervention made? No     Client is experiencing pain in right arm due to broken bone. Client is on BP medication and currently out but has appointment with PCP for medication refill.  Joanna Dollar, RN  [phone].

## 2017-01-16 NOTE — Congregational Nurse Program (Signed)
Congregational Nurse Program Note  Date of Encounter: 01/16/2017  Past Medical History: Past Medical History:  Diagnosis Date  . Gout   . Hypertension   . Kidney infection   . Migraine   . MRSA infection     Encounter Details:     CNP Questionnaire - 01/16/17 1447      Patient Demographics   Is this a new or existing patient? Existing   Patient is considered a/an Not Applicable   Race Caucasian/White     Patient Assistance   Location of Patient [place]   Patient's financial/insurance status Self-Pay (Uninsured)   Uninsured Patient ([place] [employer] Connects) No   Patient referred to apply for the following financial assistance Not Applicable   Food insecurities addressed Provided food supplies   Transportation assistance Yes   Type of Assistance [facility]   Assistance securing medications No   Educational health offerings Hypertension     Encounter Details   Primary purpose of visit Acute Illness/Condition Visit   Was an Emergency Department visit averted? Not Applicable   Does patient have a medical provider? Yes   Patient referred to Follow up with established PCP   Was a mental health screening completed? (GAINS tool) No   Does patient have dental issues? No   Does patient have vision issues? No   Does your patient have an abnormal blood pressure today? No   Since previous encounter, have you referred patient for abnormal blood pressure that resulted in a new diagnosis or medication change? No   Does your patient have an abnormal blood glucose today? No   Since previous encounter, have you referred patient for abnormal blood glucose that resulted in a new diagnosis or medication change? No   Was there a life-saving intervention made? No     Rescheduled appointment at [facility] to f/u bp and refill bp medication.  Appointment 01/26/17 1:30.  Arranged transportation with [facility]. Client will take referral form to appointment.  Tarri Fuller   [phone].

## 2017-01-25 ENCOUNTER — Encounter (INDEPENDENT_AMBULATORY_CARE_PROVIDER_SITE_OTHER): Payer: Self-pay | Admitting: Orthopaedic Surgery

## 2017-01-25 ENCOUNTER — Ambulatory Visit (INDEPENDENT_AMBULATORY_CARE_PROVIDER_SITE_OTHER): Payer: Self-pay

## 2017-01-25 ENCOUNTER — Ambulatory Visit (INDEPENDENT_AMBULATORY_CARE_PROVIDER_SITE_OTHER): Payer: Self-pay | Admitting: Orthopaedic Surgery

## 2017-01-25 VITALS — BP 159/112 | HR 78 | Resp 14 | Ht 61.0 in | Wt 165.0 lb

## 2017-01-25 DIAGNOSIS — S5291XD Unspecified fracture of right forearm, subsequent encounter for closed fracture with routine healing: Secondary | ICD-10-CM

## 2017-01-25 NOTE — Progress Notes (Signed)
   Office Visit Note   Patient: Joanna Perry           Date of Birth: 1970/06/09           MRN: [id number] Visit Date: 01/25/2017              Requested by: No referring provider defined for this encounter. PCP: Patient, No Pcp Per   Assessment & Plan: Visit Diagnoses:  1. Forearm fracture, right, closed, with routine healing, subsequent encounter     Plan: 3-4 weeks post fracture. Films demonstrate excellent healing Continue with splint immobilization and return 2-3 weeks Follow-Up Instructions: No Follow-up on file.   Orders:  No orders of the defined types were placed in this encounter.  No orders of the defined types were placed in this encounter.     Procedures: No procedures performed   Clinical Data: No additional findings.   Subjective: Chief Complaint  Patient presents with  . Right Wrist - Fracture    Joanna Perry is a 46 y o that had a R closed fx of R forearm.  Still experiencing some pain about the ulnar aspect of the right distal forearm in the area of the fracture. No numbness tingling fever or chills  HPI  Review of Systems   Objective: Vital Signs: BP (!) 159/112   Pulse 78   Resp 14   Ht 5\' 1"  (1.549 m)   Wt 165 lb (74.8 kg)   BMI 31.18 kg/m   Physical Exam  Ortho Exam long-arm splint was removed. Skin intact. Some stiffness of the elbow and wrist given the period of immobilization. Neurovascular exam intact. Some tenderness about the ulnar near the fracture. Mild edema in that area.  Specialty Comments:  No specialty comments available.  Imaging: No results found.   PMFS History: Patient Active Problem List   Diagnosis Date Noted  . Closed fracture of right forearm 01/11/2017   Past Medical History:  Diagnosis Date  . Gout   . Hypertension   . Kidney infection   . Migraine   . MRSA infection     History reviewed. No pertinent family history.  Past Surgical History:  Procedure Laterality Date  . left leg     MRSA   Social History   Occupational History  . Not on file.   Social History Main Topics  . Smoking status: Never Smoker  . Smokeless tobacco: Never Used  . Alcohol use No     Comment: once a year  . Drug use: No     Comment: somoked crack 3 days ago  Pt denies 08/13/2014  . Sexual activity: Yes    Birth control/ protection: Implant     Garald Balding, MD   Note - This record has been created using Editor, commissioning.  Chart creation errors have been sought, but may not always  have been located. Such creation errors do not reflect on  the standard of medical care.

## 2017-01-25 NOTE — Progress Notes (Deleted)
   Office Visit Note   Patient: Joanna Perry           Date of Birth: 1971/05/13           MRN: [id number] Visit Date: 01/25/2017              Requested by: No referring provider defined for this encounter. PCP: Patient, No Pcp Per   Assessment & Plan: Visit Diagnoses: No diagnosis found.  Plan: ***  Follow-Up Instructions: No Follow-up on file.   Orders:  No orders of the defined types were placed in this encounter.  No orders of the defined types were placed in this encounter.     Procedures: No procedures performed   Clinical Data: No additional findings.   Subjective: Chief Complaint  Patient presents with  . Right Wrist - Fracture    [name] is a 46 y o that had a R closed fx of R forearm.    HPI  Review of Systems   Objective: Vital Signs: BP (!) 159/112   Pulse 78   Resp 14   Ht 5\' 1"  (1.549 m)   Wt 165 lb (74.8 kg)   BMI 31.18 kg/m   Physical Exam  Ortho Exam  Specialty Comments:  No specialty comments available.  Imaging: No results found.   PMFS History: Patient Active Problem List   Diagnosis Date Noted  . Closed fracture of right forearm 01/11/2017   Past Medical History:  Diagnosis Date  . Gout   . Hypertension   . Kidney infection   . Migraine   . MRSA infection     History reviewed. No pertinent family history.  Past Surgical History:  Procedure Laterality Date  . left leg     MRSA   Social History   Occupational History  . Not on file.   Social History Main Topics  . Smoking status: Never Smoker  . Smokeless tobacco: Never Used  . Alcohol use No     Comment: once a year  . Drug use: No     Comment: somoked crack 3 days ago  Pt denies 08/13/2014  . Sexual activity: Yes    Birth control/ protection: Implant

## 2017-02-09 NOTE — Congregational Nurse Program (Signed)
Congregational Nurse Program Note  Date of Encounter: 02/09/2017  Past Medical History: Past Medical History:  Diagnosis Date  . Gout   . Hypertension   . Kidney infection   . Migraine   . MRSA infection     Encounter Details:     CNP Questionnaire - 02/09/17 0200      Patient Demographics   Is this a new or existing patient? Existing   Patient is considered a/an Not Applicable   Race Caucasian/White     Patient Assistance   Location of Patient [place]   Patient's financial/insurance status Self-Pay (Uninsured)   Uninsured Patient ([place] [employer]/Care Connects) No   Patient referred to apply for the following financial assistance Not Applicable   Food insecurities addressed Not Applicable   Transportation assistance Yes   Type of Assistance [facility]   Assistance securing medications No   Educational health offerings Hypertension     Encounter Details   Primary purpose of visit Acute Illness/Condition Visit   Was an Emergency Department visit averted? Not Applicable   Does patient have a medical provider? Yes   Patient referred to Follow up with established PCP   Was a mental health screening completed? (GAINS tool) No   Does patient have dental issues? No   Does patient have vision issues? No   Does your patient have an abnormal blood pressure today? No   Since previous encounter, have you referred patient for abnormal blood pressure that resulted in a new diagnosis or medication change? No   Does your patient have an abnormal blood glucose today? No   Since previous encounter, have you referred patient for abnormal blood glucose that resulted in a new diagnosis or medication change? No   Was there a life-saving intervention made? No     Appointment rescheduled for BP check and refill medication.  Client also complaining of urinary symptoms. Appointment scheduled for 9/27//18 1:00.  Transportation arranged with [facility].  Client notified of appt.  Tarri Fuller  [phone].

## 2017-02-15 ENCOUNTER — Encounter (INDEPENDENT_AMBULATORY_CARE_PROVIDER_SITE_OTHER): Payer: Self-pay | Admitting: Orthopaedic Surgery

## 2017-02-15 ENCOUNTER — Other Ambulatory Visit (INDEPENDENT_AMBULATORY_CARE_PROVIDER_SITE_OTHER): Payer: Self-pay

## 2017-02-15 ENCOUNTER — Ambulatory Visit (INDEPENDENT_AMBULATORY_CARE_PROVIDER_SITE_OTHER): Payer: Self-pay | Admitting: Orthopaedic Surgery

## 2017-02-15 ENCOUNTER — Ambulatory Visit (INDEPENDENT_AMBULATORY_CARE_PROVIDER_SITE_OTHER): Payer: Self-pay

## 2017-02-15 VITALS — BP 120/93 | HR 69 | Resp 14 | Ht 65.0 in | Wt 165.0 lb

## 2017-02-15 DIAGNOSIS — M79632 Pain in left forearm: Secondary | ICD-10-CM

## 2017-02-15 DIAGNOSIS — M79631 Pain in right forearm: Secondary | ICD-10-CM

## 2017-02-15 MED ORDER — OXYCODONE-ACETAMINOPHEN 5-325 MG PO TABS: 1.0000 | ORAL_TABLET | Freq: Two times a day (BID) | ORAL | 0 refills | Status: DC | PRN

## 2017-02-15 NOTE — Progress Notes (Signed)
Office Visit Note   Patient: Joanna Perry           Date of Birth: 08-27-1970           MRN: [id number] Visit Date: 02/15/2017              Requested by: No referring provider defined for this encounter. PCP: Patient, No Pcp Per   Assessment & Plan: Visit Diagnoses:  1. Left forearm pain   6 weeks status post nondisplaced fracture of right distal third ulna  Plan: long volar wrist splint. Follow-up 1 month. Oxycodone 05/325#30.might be developing a regional pain syndrome.long discussion regarding her pain and need to begin range of motion of her wrist.no evidence of nonunion  Follow-Up Instructions: Return in about 1 month (around 03/17/2017).   Orders:  Orders Placed This Encounter  Procedures  . XR Forearm Right   No orders of the defined types were placed in this encounter.     Procedures: No procedures performed   Clinical Data: No additional findings.   Subjective: Chief Complaint  Patient presents with  . Right Forearm - Fracture    Ms. Joanna Perry is a 46 y o with a closed RIght forearm fx.  She relates she fell Friday and landed on her arm with pain.  Mrs. Joanna Perry relates that she still having some pain in her right forearm despite the immobilization. She has fallen out 1 or 2 occasions which she thinks may have aggravated her pain. No numbness or tingling. Pain is still localized along the distal ulna  HPI  Review of Systems  Constitutional: Negative for chills, fatigue and fever.  Eyes: Negative for itching.  Respiratory: Negative for chest tightness and shortness of breath.   Cardiovascular: Positive for chest pain and palpitations. Negative for leg swelling.  Gastrointestinal: Negative for blood in stool, constipation and diarrhea.  Endocrine: Negative for polyuria.  Genitourinary: Negative for dysuria.  Musculoskeletal: Positive for back pain. Negative for joint swelling, neck pain and neck stiffness.  Allergic/Immunologic: Negative for  immunocompromised state.  Neurological: Positive for weakness. Negative for dizziness and numbness.  Hematological: Does not bruise/bleed easily.  Psychiatric/Behavioral: The patient is not nervous/anxious.      Objective: Vital Signs: BP (!) 120/93   Pulse 69   Resp 14   Ht 5\' 5"  (1.651 m)   Wt 165 lb (74.8 kg)   BMI 27.46 kg/m   Physical Exam  Ortho Exam short arm splint removed.films reveal progressive healing with callus. Still some local tenderness over the distal third of the ulna in the area of the fracture. Skin intact. No ecchymosis or erythema. Many areas of localized tenderness.Skin warm. Neurovascular exam intact. Specialty Comments:  No specialty comments available.  Imaging: Xr Forearm Right  Result Date: 02/15/2017 ilms of the right forearm were obtained in 3 projections. There is progressive healing of the distal third of the ulna fracture. No displacement. Progressive callus.    PMFS History: Patient Active Problem List   Diagnosis Date Noted  . Closed fracture of right forearm 01/11/2017   Past Medical History:  Diagnosis Date  . Gout   . Hypertension   . Kidney infection   . Migraine   . MRSA infection     No family history on file.  Past Surgical History:  Procedure Laterality Date  . left leg     MRSA   Social History   Occupational History  . Not on file.   Social History Main Topics  .  Smoking status: Never Smoker  . Smokeless tobacco: Never Used  . Alcohol use No     Comment: once a year  . Drug use: No     Comment: somoked crack 3 days ago  Pt denies 08/13/2014  . Sexual activity: Yes    Birth control/ protection: Implant

## 2017-02-28 NOTE — Congregational Nurse Program (Signed)
Congregational Nurse Program Note  Date of Encounter: 02/27/2017  Past Medical History: Past Medical History:  Diagnosis Date  . Gout   . Hypertension   . Kidney infection   . Migraine   . MRSA infection     Encounter Details:     CNP Questionnaire - 02/27/17 1341      Patient Demographics   Is this a new or existing patient? Existing   Patient is considered a/an Not Applicable   Race Caucasian/White     Patient Assistance   Location of Patient [place]   Patient's financial/insurance status Self-Pay (Uninsured)   Uninsured Patient ([place] [employer]/Care Connects) No   Patient referred to apply for the following financial assistance Not Applicable   Food insecurities addressed Provided food supplies;Not Applicable   Transportation assistance Yes   Type of Assistance [facility]   Assistance securing medications No   Educational health offerings Navigating the healthcare system     Encounter Details   Primary purpose of visit [place]   Was an Emergency Department visit averted? Not Applicable   Does patient have a medical provider? Yes   Patient referred to Follow up with established PCP   Was a mental health screening completed? (GAINS tool) No   Does patient have dental issues? No   Does patient have vision issues? No   Does your patient have an abnormal blood pressure today? No   Since previous encounter, have you referred patient for abnormal blood pressure that resulted in a new diagnosis or medication change? No   Does your patient have an abnormal blood glucose today? No   Since previous encounter, have you referred patient for abnormal blood glucose that resulted in a new diagnosis or medication change? No   Was there a life-saving intervention made? No    Referral form for client's next appoinment 04/13/17 1:30 at [facility]. Transportatation arranged with [facility].  Tarri Fuller  [phone].

## 2017-03-15 ENCOUNTER — Ambulatory Visit (INDEPENDENT_AMBULATORY_CARE_PROVIDER_SITE_OTHER): Payer: Self-pay

## 2017-03-15 ENCOUNTER — Ambulatory Visit (INDEPENDENT_AMBULATORY_CARE_PROVIDER_SITE_OTHER): Payer: Self-pay | Admitting: Orthopaedic Surgery

## 2017-03-15 ENCOUNTER — Encounter (INDEPENDENT_AMBULATORY_CARE_PROVIDER_SITE_OTHER): Payer: Self-pay | Admitting: Orthopaedic Surgery

## 2017-03-15 VITALS — BP 148/102 | HR 64 | Resp 14 | Ht 65.0 in | Wt 165.0 lb

## 2017-03-15 DIAGNOSIS — S52202G Unspecified fracture of shaft of left ulna, subsequent encounter for closed fracture with delayed healing: Secondary | ICD-10-CM

## 2017-03-15 MED ORDER — ONDANSETRON HCL 4 MG PO TABS: 4.0000 mg | ORAL_TABLET | Freq: Three times a day (TID) | ORAL | 0 refills | Status: DC | PRN

## 2017-03-15 NOTE — Progress Notes (Signed)
Office Visit Note   Patient: Joanna Perry           Date of Birth: 07-24-1970           MRN: [id number] Visit Date: 03/15/2017              Requested by: No referring provider defined for this encounter. PCP: Patient, No Pcp Per   Assessment & Plan: Visit Diagnoses:  1. Unspecified fracture of shaft of left ulna, subsequent encounter for closed fracture with delayed healing     Plan: Continue with volar wrist splint. 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 She is doing a lot of pushing and pulling and lifting which causes her some pain. She needs to work and wants to continue working. Films today reveal anatomic position of the ulna fracture with callus. I've counseled her about being too aggressive in terms of her work but she notes that she just cannot put up of the pain because she "has to work". I'll see her in a month. She needs to work on range of motion of her wrist.She is developing some loss of pronation and supination.  Follow-Up Instructions: Return in about 1 month (around 04/15/2017).   Orders:  Orders Placed This Encounter  Procedures  . XR Forearm Right   Meds ordered this encounter  Medications  . ondansetron (ZOFRAN) 4 MG tablet    Sig: Take 1 tablet (4 mg total) by mouth every 8 (eight) hours as needed for nausea or vomiting.    Dispense:  20 tablet    Refill:  0      Procedures: No procedures performed   Clinical Data: No additional findings.   Subjective: Chief Complaint  Patient presents with  . Right Forearm - Pain, Routine Post Op    Joanna Perry is a 46 y o that is here for a F/U of Right forearm. She relates her right wrist hurts with limited ROM.  10 weeks status post nondisplaced right ulnar fracture at the junction of the middle and distal thirds. Still wearing a volar wrist splint. Went back to work about 3 weeks ago. I "need to work". 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 That created  some pain. She's not having any swelling or numbness in her hands. She does not smoke.  HPI  Review of Systems  Constitutional: Negative for chills, fatigue and fever.  Eyes: Negative for itching.  Respiratory: Negative for chest tightness and shortness of breath.   Cardiovascular: Negative for chest pain, palpitations and leg swelling.  Gastrointestinal: Negative for blood in stool, constipation and diarrhea.  Endocrine: Negative for polyuria.  Genitourinary: Negative for dysuria.  Musculoskeletal: Positive for back pain. Negative for joint swelling, neck pain and neck stiffness.  Allergic/Immunologic: Negative for immunocompromised state.  Neurological: Positive for headaches. Negative for dizziness and numbness.  Hematological: Does not bruise/bleed easily.  Psychiatric/Behavioral: The patient is nervous/anxious.      Objective: Vital Signs: BP (!) 148/102   Pulse 64   Resp 14   Ht 5\' 5"  (1.651 m)   Wt 165 lb (74.8 kg)   BMI 27.46 kg/m   Physical Exam  Ortho Exam awake alert and oriented 3. Comfortable sitting. Still has some tenderness over the the ulna fracture right forearm. Skin intact. No swelling. No induration. Had some mild ulnar wrist pain that she notes is "old". Full fist and release. No swelling. Neurovascular exam intact. No  deformity. Does have some pain with pronation and supination. No pain about the elbow.  Specialty Comments:  No specialty comments available.  Imaging: Xr Forearm Right  Result Date: 03/15/2017 Films of the right forearm were obtained in AP lateral projections. The prior fracture at the junction of the middle and distal thirds of the ulna is identified. There is good callus about the fracture. No displacement.    PMFS History: Patient Active Problem List   Diagnosis Date Noted  . Unspecified fracture of shaft of left ulna, subsequent encounter for closed fracture with delayed healing 03/15/2017  . Closed fracture of right forearm  01/11/2017   Past Medical History:  Diagnosis Date  . Gout   . Hypertension   . Kidney infection   . Migraine   . MRSA infection     History reviewed. No pertinent family history.  Past Surgical History:  Procedure Laterality Date  . left leg     MRSA   Social History   Occupational History  . Not on file.   Social History Main Topics  . Smoking status: Never Smoker  . Smokeless tobacco: Never Used  . Alcohol use No     Comment: once a year  . Drug use: No     Comment: somoked crack 3 days ago  Pt denies 08/13/2014  . Sexual activity: Yes    Birth control/ protection: Implant

## 2017-06-22 ENCOUNTER — Telehealth: Payer: Self-pay

## 2017-06-22 NOTE — Telephone Encounter (Signed)
Telephone to inquire about need for a wheelchair for an elderly friend. Telephone to [facility] to inquire about wheelchair; will followup on this need.  Telephone to CNP have found a used wheelchair and will arrangements made to deliver to elderly lady.  Mariam Dollar, RN  Telephone to home of elderly lady, spoke with son and wheelchair will be delivered this week.  Son was very appreciative of meeting this need for his mother. Mariam Dollar, RN [phone].

## 2017-08-31 NOTE — Congregational Nurse Program (Signed)
Congregational Nurse Program Note  Date of Encounter: 08/31/2017  Past Medical History: Past Medical History:  Diagnosis Date  . Gout   . Hypertension   . Kidney infection   . Migraine   . MRSA infection     Encounter Details: CNP Questionnaire - 08/31/17 1252      Questionnaire   Patient Status  Not Applicable    Race  White or Caucasian    Location Patient [place]  Not Applicable    Uninsured  Uninsured (Subsequent visits/quarter)    Food  No food insecurities    Housing/Utilities  No permanent housing    Transportation  No transportation needs    Interpersonal Safety  No, do not feel physically and emotionally safe where you currently live    Medication  Yes, have medication insecurities    Medical Provider  Yes    Referrals  Area Agency    ED Visit Averted  Not Applicable    Life-Saving Intervention Made  Not Applicable     Client called in need of appointment to obtain new RX for BP medication.  Assisted client with appointment on 09/27/17 3:00.  Called client with appointment information, states this date is not suitable has other commitment.  Client will call to reschedule appointment.  Instructed to call CN with new date.  Joanna Perry   [phone].

## 2017-10-02 ENCOUNTER — Telehealth: Payer: Self-pay

## 2017-10-02 NOTE — Telephone Encounter (Signed)
Telephone from client to reschedule missed appt at  [facility]. Appt. 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 Information and phone number of [facility] given. This agency has donated equipment  Needs of the community.  Joanna Perry  [phone].

## 2017-10-17 ENCOUNTER — Telehealth: Payer: Self-pay

## 2017-10-17 NOTE — Telephone Encounter (Signed)
Appointment schedule at [facility] 10/18/17 1:30.  Client was given instructions to bring recent  Check stubs, proof in household income to update information. Payment for service is basesd on income. Dental appointments not covered under [employer].  Joanna Perry  [phone].

## 2017-11-24 NOTE — Congregational Nurse Program (Unsigned)
Congregational Nurse Program Note  Date of Encounter: 03/06/2017  Past Medical History: Past Medical History:  Diagnosis Date  . Gout   . Hypertension   . Kidney infection   . Migraine   . MRSA infection     Encounter Details:   Client was assisted with appointment at [facility] with established PCP for followup.  [facility] arranged for 04/13/17.  Tarri Fuller  [phone].

## 2018-02-27 NOTE — Congregational Nurse Program (Signed)
Congregational Nurse Program Note  Date of Encounter: 02/27/2018  Past Medical History: Past Medical History:  Diagnosis Date  . Gout   . Hypertension   . Kidney infection   . Migraine   . MRSA infection     Encounter Details: CNP Questionnaire - 02/27/18 0915      Questionnaire   Patient Status  Not Applicable    Race  White or Caucasian    Location Patient [place], [employer]  Not Applicable    Uninsured  Uninsured (Subsequent visits/quarter)    Food  No food insecurities    Housing/Utilities  Yes, have permanent housing    Transportation  Yes, need transportation assistance    Interpersonal Safety  Yes, feel physically and emotionally safe where you currently live    Medication  Yes, have medication insecurities    Medical Provider  Yes    Referrals  Area Agency    ED Visit Averted  Not Applicable    Life-Saving Intervention Made  Not Applicable      Client was assisted with appointment at [facility] with established provider, followup BP, needs new RX.  Cient is not complaint with keeping appointments. Client states BPS reading have been high. Had taken yesterday, 164/120.  Encouraged client importance of keeping appts. To get rxs filled.  Appt made for 03/19/18 1:30. Transportation provided with [facility]. Client notified of appt.  Tarri Fuller  [phone].

## 2018-07-05 NOTE — Congregational Nurse Program (Signed)
Seen while at the lunch program at the [facility] in [place] she needs to be seen because her prescription needs to be refilled for her blood pressure medication- Atenolol 71m. [facility] B P 168/120 P 66. [facility]. Discussed the dangers of HTN, reviewed s/s of stroke, discussed heart healthy diet, and importance of getting proper exercise.[facility], spoke with June appointment scheduled for Monday 1-13. Client called and told all the info needed along with paperwork to be done prior to visit. Met client later that day and paperwork given. Voiced understanding of info and co-pay EErma HeritageRN, [phone]

## 2018-08-24 NOTE — Congregational Nurse Program (Signed)
Stated she forgot her appointment at [facility] but she felt better. Explained the importance of keeping scheduled appointments, taking prescribed medications, following a heart healthy diet, and getting the proper exercise. Voiced understanding. B P 146/90 P [address] RN, [place] Program, [phone]

## 2019-12-17 NOTE — Congregational Nurse Program (Signed)
Stated she is having a lot of problems with stress and anxiety. Suggested that she might benefit  From seeing a counselor at [facility] and she agreed. Told I will call for an appointment and [facility] for transportation.Also said she needed medication and dental assistance. BP 151/105  P 66  4Pm telephone call to [facility], spoke with Tiffany to schedule an appointment.and told that client had been discharged. Will need to come in any  day Monday - Friday before 2:30 TCT Clarene Critchley, no answer . Erma Heritage RN, [place], [phone]

## 2020-01-07 NOTE — Congregational Nurse Program (Signed)
Stated she is having problems with anxiety and stress. Asked if she would like to be seen at [facility] and she agreed that she needed to be seen. Called [facility] and spoke with Tiffany. Told that she would need to come in as a walkin  Also stated she wants to be seen at the [facility]. Called [facility] and spoke to [place] . Appointment scheduled for   August 4th at [facility] to bring  ID.wear a mask, and bring all medications. BP 173/113 P- [address], [place], [place]

## 2020-01-27 NOTE — Congregational Nurse Program (Signed)
Seen at site today and inquired about going to [facility] for Gyn exam but left abruptly before completing conversation. Stated she will see me on Wed. Erma Heritage RN, [facility], [phone]

## 2020-02-24 NOTE — Congregational Nurse Program (Signed)
Stated she was having a lot of abdominal pain and discomfort on her left side. "I have a lot of swelling on my left side and I am experiencing some nausea and vomiting on a daily basis esp at  night." Can't eat tomatoes,peppers, onions, fried foods. Has been taking edall of her prescribed meds as ordered. Skin warm and dry, color pale. Only ate a few bites of her lunch. Temp 9 R7.5, BP 137/95, P 67, blood  glucose 103. Told will schedule appointment at [place], [place]

## 2020-02-27 NOTE — Congregational Nurse Program (Signed)
Patient missed appointment on Tuesday at [facility]. Stated she did not have any money for gas but She did call and cancelled appointment. Continues to complain of abdominal pain and n/v esp after Eating greasey food and certain vegetables. Reviewed her dietary habits and suggested she eat more bland foods, .no fried and rich or spicey  foods. Did not eat lunch today. Told will call and make another appointment. BP 137/90, HR [address] [place], [place] [phone]

## 2020-06-09 ENCOUNTER — Telehealth: Payer: Self-pay

## 2020-06-09 NOTE — Telephone Encounter (Signed)
Returned call client left on voicemail. Client had been referred to [employer] for assistance with eye exam and glasses. Client reports she received a bill from [facility] in [place] for per client charge of 600.00$  Client reports that [employer] took the voucher to [place]: will contact [employer] representative to determine next steps and call client back. Client agreeable.   Update: 1:25PM [employer] representative Mr. Josefa Half returned call, stating that he did take voucher to [facility]. Runell Gess should cover cost of an eye exam and a basic pair of glasses. He recommended client follow up with [facility] to determine reason for charge. Contacted Ms. Minette Brine and asked her to connect with [facility] To ask what those charges were for and the reason the voucher did not cover the exam and basic glasses. Client reports there were no extras added to glasses to her knowledge. She states she will call [facility]. Asked client to update RN after she contacts the eye dr. Client reports she will do so.   Client reports she is current with [facility]. Will plan referral to [facility] ([place] work Theatre manager) for follow up regarding [place] concerns and any other SDOHs that client may have need for services.  [facility]

## 2020-06-24 NOTE — Congregational Nurse Program (Signed)
  Dept: [phone]   Congregational Nurse Program Note  Date of Encounter: 06/24/2020  Past Medical History: Past Medical History:  Diagnosis Date  . Gout   . Hypertension   . Kidney infection   . Migraine   . MRSA infection     Encounter Details:  CNP Questionnaire - 06/24/20 1612      Questionnaire   Do you give verbal consent to treat you today? Yes    Visit [facility] or Counselling psychologist Patient Served At [employer] [employer]    Patient Status Not Applicable    Medical Provider No    Insurance Uninsured (Includes Orange Card/Care [place])    Intervention Advocate;Assess (including screenings);[place] Dept         Stated she was seen by an optometrist and was told that her vision had gotten worse and she needed to be seen by her PCP and have her medications reviewed and possibly changed ASAP.  had gotten worse.Would like to be seen through the [facility] program at [facility]. Also continue to complain of pain on her left flank area. Discussed the importance of following through with any  Scheduled appointments. Voiced understanding. BP 146/103  [address] RN, [place], [phone]

## 2020-08-05 ENCOUNTER — Encounter: Payer: Self-pay | Admitting: Orthopaedic Surgery

## 2020-08-05 ENCOUNTER — Other Ambulatory Visit: Payer: Self-pay

## 2020-08-05 ENCOUNTER — Ambulatory Visit (INDEPENDENT_AMBULATORY_CARE_PROVIDER_SITE_OTHER): Payer: Self-pay | Admitting: Orthopaedic Surgery

## 2020-08-05 DIAGNOSIS — M65332 Trigger finger, left middle finger: Secondary | ICD-10-CM | POA: Insufficient documentation

## 2020-08-05 DIAGNOSIS — M65311 Trigger thumb, right thumb: Secondary | ICD-10-CM

## 2020-08-05 MED ORDER — LIDOCAINE HCL 1 % IJ SOLN
1.0000 mL | INTRAMUSCULAR | Status: AC | PRN
  Administered 2020-08-05: 1 mL

## 2020-08-05 NOTE — Progress Notes (Signed)
Office Visit Note   Patient: Joanna Perry           Date of Birth: 1970/10/13           MRN: [id number] Visit Date: 08/05/2020              Requested by: No referring provider defined for this encounter. PCP: Patient, No Pcp Per   Assessment & Plan: Visit Diagnoses:  1. Trigger finger, left middle finger   2. Trigger finger of right thumb     Plan: Recent diagnosis of rheumatoid arthritis.  Has experienced active triggering of the right thumb and left fourth finger.  After much discussion she would like to try cortisone injections.  She is aware of the potential limitations  Follow-Up Instructions: Return if symptoms worsen or fail to improve.   Orders:  Orders Placed This Encounter  Procedures  . Hand/UE Inj: L long A1  . Hand/UE Inj: R thumb A1   No orders of the defined types were placed in this encounter.     Procedures: Hand/UE Inj: L long A1 for trigger finger on 08/05/2020 3:40 PM Medications: 1 mL lidocaine 1 %  3 mg betamethasone injected with Xylocaine  Hand/UE Inj: R thumb A1 for trigger finger on 08/05/2020 3:41 PM Medications: 1 mL lidocaine 1 %  3 mg betamethasone injected with Xylocaine into the A1 pulley right thumb      Clinical Data: No additional findings.   Subjective: Chief Complaint  Patient presents with  . Right Hand - Pain  . Left Hand - Pain  Patient presents today for bilateral hand pain. She said that she has been experiencing "sticking" in her right thumb and middle finger, and also in her left middle finger. She said that they get stuck when she flexes those fingers. It is painful and worsening. She cannot grip anything. She is right hand dominant.   HPI  Review of Systems   Objective: Vital Signs: Ht 5\' 5"  (1.651 m)   Wt 165 lb (74.8 kg)   BMI 27.46 kg/m   Physical Exam Constitutional:      Appearance: She is well-developed and well-nourished.  HENT:     Mouth/Throat:     Mouth: Oropharynx is clear and moist.   Eyes:     Extraocular Movements: EOM normal.     Pupils: Pupils are equal, round, and reactive to light.  Pulmonary:     Effort: Pulmonary effort is normal.  Skin:    General: Skin is warm and dry.  Neurological:     Mental Status: She is alert and oriented to person, place, and time.  Psychiatric:        Mood and Affect: Mood and affect normal.        Behavior: Behavior normal.     Ortho Exam painful small nodules over the palmar aspect of the right thumb A1 pulley and left long finger A1 pulley.  Has active triggering.  Neurologically intact. Specialty Comments:  No specialty comments available.  Imaging: No results found.   PMFS History: Patient Active Problem List   Diagnosis Date Noted  . Trigger finger, left middle finger 08/05/2020  . Trigger finger of right thumb 08/05/2020  . Unspecified fracture of shaft of left ulna, subsequent encounter for closed fracture with delayed healing 03/15/2017  . Closed fracture of right forearm 01/11/2017   Past Medical History:  Diagnosis Date  . Gout   . Hypertension   . Kidney infection   .  Migraine   . MRSA infection     History reviewed. No pertinent family history.  Past Surgical History:  Procedure Laterality Date  . left leg     MRSA   Social History   Occupational History  . Not on file  Tobacco Use  . Smoking status: Never Smoker  . Smokeless tobacco: Never Used  Substance and Sexual Activity  . Alcohol use: No    Comment: once a year  . Drug use: No    Types: Cocaine    Comment: somoked crack 3 days ago  Pt denies 08/13/2014  . Sexual activity: Yes    Birth control/protection: Implant

## 2020-10-05 ENCOUNTER — Other Ambulatory Visit: Payer: Self-pay

## 2020-10-05 MED ORDER — HYDROXYZINE PAMOATE 25 MG PO CAPS
25.0000 mg | ORAL_CAPSULE | Freq: Two times a day (BID) | ORAL | 0 refills | Status: DC | PRN
  Filled 2020-10-05: qty 60, 30d supply, fill #0

## 2020-10-05 MED ORDER — DULOXETINE HCL 30 MG PO CPEP
30.0000 mg | ORAL_CAPSULE | Freq: Every day | ORAL | 0 refills | Status: DC
  Filled 2020-10-05: qty 30, 30d supply, fill #0

## 2020-11-08 NOTE — Congregational Nurse Program (Signed)
  Dept: [phone]   Congregational Nurse Program Note  Date of Encounter: 10/14/2020  Past Medical History: Past Medical History:  Diagnosis Date   Gout    Hypertension    Kidney infection    Migraine    MRSA infection     Encounter Details:  CNP Questionnaire - 10/14/20 2319       Questionnaire   Do you give verbal consent to treat you today? Yes    Visit [facility] or Counselling psychologist Patient Served At [employer] [employer]    Patient Status Not Applicable    Medical Provider No    Insurance Uninsured (Includes Orange Card/Care [place])    Intervention Advocate;Assess (including screenings)    Referrals Mobile Bus/Van - Health Dept           Stated she was recently discharged from [facility] in [place] for rehab and medication adjustment . Today complains of arthritic pains in hands and arms.Requesting to be seen at [facility]  . TCT Health Dept spoke with [facility] appointment scheduled  10-20-2020 Tuesday BP 143/95 Pulse 69,; Blood Glucose 126- Random. Erma Heritage RN, [place], [phone]

## 2020-11-18 ENCOUNTER — Telehealth: Payer: Self-pay

## 2020-11-18 NOTE — Telephone Encounter (Signed)
Received call from [place] program. Client inquiring about dental and she is at [facility]. Client is on dental waitlist since 3/22 but states she has had to be placed on antibiotics and has multiple dental issues and infection. Client has [facility] appointment 11/19/20 discussed client talking to her provider at [facility]. If he determines this is emergent he can change her referral to [facility] as emergent and [facility] would attempt to get her seen next availability. Discussed that only her medical provider can determine if need is urgent. Client reports understanding. Called Erma Heritage RN back to update her on call. 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  Will follow as needed.  [place] [employer]

## 2020-12-09 ENCOUNTER — Other Ambulatory Visit: Payer: Self-pay

## 2020-12-09 ENCOUNTER — Encounter: Payer: Self-pay | Admitting: Orthopaedic Surgery

## 2020-12-09 ENCOUNTER — Ambulatory Visit (INDEPENDENT_AMBULATORY_CARE_PROVIDER_SITE_OTHER): Payer: Self-pay | Admitting: Orthopaedic Surgery

## 2020-12-09 VITALS — Wt 165.0 lb

## 2020-12-09 DIAGNOSIS — M65332 Trigger finger, left middle finger: Secondary | ICD-10-CM

## 2020-12-09 DIAGNOSIS — M65311 Trigger thumb, right thumb: Secondary | ICD-10-CM

## 2020-12-09 MED ORDER — LIDOCAINE HCL 1 % IJ SOLN
1.0000 mL | INTRAMUSCULAR | Status: AC | PRN
Start: 2020-12-09 — End: 2020-12-09
  Administered 2020-12-09: 1 mL

## 2020-12-09 NOTE — Progress Notes (Signed)
Office Visit Note   Patient: Joanna Perry           Date of Birth: 1970/09/12           MRN: [id number] Visit Date: 12/09/2020              Requested by: No referring provider defined for this encounter. PCP: Patient, No Pcp Per (Inactive)   Assessment & Plan: Visit Diagnoses:  1. Trigger finger, left middle finger   2. Trigger finger of right thumb     Plan: Recurrent triggering of right thumb and left long finger.  Long discussion regarding treatment options and, specifically, surgical release.  She is concerned about her insurance in regards to surgery and wanted to have repeat injections.  Follow-Up Instructions: Return if symptoms worsen or fail to improve.   Orders:  Orders Placed This Encounter  Procedures   Hand/UE Inj: R thumb A1   Hand/UE Inj: L long A1   No orders of the defined types were placed in this encounter.     Procedures: Hand/UE Inj: R thumb A1 for trigger finger on 12/09/2020 2:04 PM Indications: pain Details: 27 G needle, volar approach Medications: 1 mL lidocaine 1 %  3 mg betamethasone injected with Marcaine to the A1 pulley right thumb   Hand/UE Inj: L long A1 for trigger finger on 12/09/2020 2:05 PM Details: 27 G needle, volar approach Medications: 1 mL lidocaine 1 %  3 mg betamethasone injected into A1 pulley left long finger with Marcaine     Clinical Data: No additional findings.   Subjective: Chief Complaint  Patient presents with   Right Hand - Pain   Left Hand - Pain  Patient presents today for recurrent left middle and right thumb trigger fingers. She was here last in March of this year and had both injected with cortisone. She is wanting to discuss her treatment options today. She said that the injections did help, but the triggering and pain started again a month ago. She is right hand dominant. She takes Ibuprofen as needed.    HPI  Review of Systems   Objective: Vital Signs: Wt 165 lb (74.8 kg)   BMI 27.46  kg/m   Physical Exam Constitutional:      Appearance: She is well-developed.  Eyes:     Pupils: Pupils are equal, round, and reactive to light.  Pulmonary:     Effort: Pulmonary effort is normal.  Skin:    General: Skin is warm and dry.  Neurological:     Mental Status: She is alert and oriented to person, place, and time.  Psychiatric:        Behavior: Behavior normal.    Ortho Exam active triggering of right thumb and left long finger with palpable nodules over the A1 pulley.  Neurovascular exam intact.  Specialty Comments:  No specialty comments available.  Imaging: No results found.   PMFS History: Patient Active Problem List   Diagnosis Date Noted   Trigger finger, left middle finger 08/05/2020   Trigger finger of right thumb 08/05/2020   Unspecified fracture of shaft of left ulna, subsequent encounter for closed fracture with delayed healing 03/15/2017   Closed fracture of right forearm 01/11/2017   Past Medical History:  Diagnosis Date   Gout    Hypertension    Kidney infection    Migraine    MRSA infection     History reviewed. No pertinent family history.  Past Surgical History:  Procedure Laterality Date  left leg     MRSA   Social History   Occupational History   Not on file  Tobacco Use   Smoking status: Never   Smokeless tobacco: Never  Substance and Sexual Activity   Alcohol use: No    Comment: once a year   Drug use: No    Types: Cocaine    Comment: somoked crack 3 days ago  Pt denies 08/13/2014   Sexual activity: Yes    Birth control/protection: Implant

## 2021-01-08 ENCOUNTER — Telehealth: Payer: Self-pay

## 2021-01-08 NOTE — Telephone Encounter (Signed)
From conversation by phone with [facility] client 01/07/21 client did not seek treatment as  recommended on 01/04/21 when she told nurse she had an abscess to her inner thigh and she stated she could not get an appointment to her provider at [facility]. She stated she had history of MRSA from a similar infection from the past.  01/07/21  3:30 PM called for a follow up. Client reports she did not go to the ER and doesn't know why. She states she used warm compresses and the area is now open and draining. Strongly recommended that client attempt to see her provider at [facility] ASAP or seek ER evaluation. She denies fever, but just keeps saying she's sick. She also during this conversation complained of headache, nausea, congestion. Recommended client that home covid test that she has. She states she is not going to call EMS and she does not have transportation to get to [facility] tomorrow if this RN can assist in getting a sick visit appointment. Discussed that [facility] can be arranged. She agreed. Called [facility] and was able to secure an appointment for client for 01/08/21 at 10:30 am with [facility]. [facility] transportation was also called and transportation was arranged for her appointment. 4:00PM Called client back, her home covid test per client is negative. Discussed that she has an appointment at [facility] at 10:30 AM on 01/08/21 and that [employer] Transportation has been arranged and will call her to confirm and she needs to answer that call. * During calls, client was very distracted talking to multiple people in the background. Asked client to focus and repeated appointment and information regarding transportation and she stated understanding. Told client to wear a mask.  01/08/21 8am Arrive to a phone message from Client that she has missed the call from transportation and does not know when or if she is going to be picked up.  This RN contacted [employer] and spoke with BEN and confirmed client will be  picked up at 10 am this morning and taken to [facility]. Called client this am 8:25 AM told client that transportation would pick her up this morning at 10am. Again multiple conversations in the background. Client reports she will be ready. 8:40 AM client calls back to ask when transportation is coming to get her and will they take her back home. Discussed that transportation will get her home after appointment. She now states that area is draining still, but she thinks there is another area of concern also. Discussed that this is the reason she is being seen today and the provider at [facility] will determine course of action, antibiotics etc. Client reports understanding.   Will plan phone follow up after this am appointment.   [place] [employer]

## 2021-02-08 NOTE — Congregational Nurse Program (Signed)
  Dept: [phone]   Congregational Nurse Program Note  Date of Encounter: 02/08/2021  Past Medical History: Past Medical History:  Diagnosis Date   Gout    Hypertension    Kidney infection    Migraine    MRSA infection     Encounter Details:  CNP Questionnaire - 02/08/21 1240       Questionnaire   Do you give verbal consent to treat you today? Yes    Visit Setting Church or Counselling psychologist Patient Served At [employer] [employer]    Patient Status Not Applicable    Medical Provider No    Insurance Uninsured (Includes Orange Card/Care [place])    Intervention Advocate;Assess (including screenings)    Referrals Mobile Bus/Van - Health Dept            Stated she has a follow-up appointment for skin rash on legs and check on  her blood pressure. Has been taking medications as prescribed and continues to  [employer] and do exercise Erma Heritage RN, [place], [phone]

## 2021-04-15 DIAGNOSIS — N3001 Acute cystitis with hematuria: Secondary | ICD-10-CM | POA: Diagnosis not present

## 2021-04-15 DIAGNOSIS — B3731 Acute candidiasis of vulva and vagina: Secondary | ICD-10-CM | POA: Diagnosis not present

## 2021-04-15 DIAGNOSIS — R1030 Lower abdominal pain, unspecified: Secondary | ICD-10-CM | POA: Diagnosis not present

## 2021-04-15 DIAGNOSIS — N2889 Other specified disorders of kidney and ureter: Secondary | ICD-10-CM | POA: Diagnosis not present

## 2021-04-16 DIAGNOSIS — M549 Dorsalgia, unspecified: Secondary | ICD-10-CM | POA: Diagnosis not present

## 2021-04-16 DIAGNOSIS — N3001 Acute cystitis with hematuria: Secondary | ICD-10-CM | POA: Diagnosis not present

## 2021-04-16 DIAGNOSIS — R1030 Lower abdominal pain, unspecified: Secondary | ICD-10-CM | POA: Diagnosis not present

## 2021-04-16 DIAGNOSIS — Z91018 Allergy to other foods: Secondary | ICD-10-CM | POA: Diagnosis not present

## 2021-04-16 DIAGNOSIS — Z765 Malingerer [conscious simulation]: Secondary | ICD-10-CM | POA: Diagnosis not present

## 2021-04-16 DIAGNOSIS — Z885 Allergy status to narcotic agent status: Secondary | ICD-10-CM | POA: Diagnosis not present

## 2021-04-16 DIAGNOSIS — Z91040 Latex allergy status: Secondary | ICD-10-CM | POA: Diagnosis not present

## 2021-04-16 DIAGNOSIS — N2889 Other specified disorders of kidney and ureter: Secondary | ICD-10-CM | POA: Diagnosis not present

## 2021-04-16 DIAGNOSIS — B3731 Acute candidiasis of vulva and vagina: Secondary | ICD-10-CM | POA: Diagnosis not present

## 2021-04-28 ENCOUNTER — Ambulatory Visit (INDEPENDENT_AMBULATORY_CARE_PROVIDER_SITE_OTHER): Payer: Self-pay

## 2021-04-28 ENCOUNTER — Ambulatory Visit (INDEPENDENT_AMBULATORY_CARE_PROVIDER_SITE_OTHER): Payer: Self-pay | Admitting: Orthopaedic Surgery

## 2021-04-28 ENCOUNTER — Other Ambulatory Visit: Payer: Self-pay

## 2021-04-28 ENCOUNTER — Ambulatory Visit: Payer: Self-pay

## 2021-04-28 ENCOUNTER — Encounter: Payer: Self-pay | Admitting: Orthopaedic Surgery

## 2021-04-28 VITALS — Ht 61.0 in | Wt 174.0 lb

## 2021-04-28 DIAGNOSIS — M25562 Pain in left knee: Secondary | ICD-10-CM

## 2021-04-28 DIAGNOSIS — M545 Low back pain, unspecified: Secondary | ICD-10-CM

## 2021-04-28 DIAGNOSIS — G8929 Other chronic pain: Secondary | ICD-10-CM
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
## 2021-07-07 ENCOUNTER — Telehealth: Payer: Self-pay

## 2021-07-07 NOTE — Telephone Encounter (Signed)
Congregational Nurse, Terrence Dupont, of [place] contacted our office ([facility] Counsellor) on today related to receiving confirmation of the patient need of service as it relates to getting medial care and assisting pt with being able to obtain a $0.00 co-payment to help her pay for her lab work and additional care in order to be seen by her PCP at the [facility].   It was confirmed with Terrence Dupont  that the [facility] is requesting the patient to be seen by her first in order to move forth.   Terrence Dupont stated now that the pt's need of service is confirmed, she will deliver the requested document on the behalf of the patient directly/in-person to the health dept and will call the patient back once delivered to confirm all that took place.    Call ended

## 2021-07-08 ENCOUNTER — Other Ambulatory Visit: Payer: Self-pay | Admitting: Physician Assistant

## 2021-07-08 ENCOUNTER — Telehealth: Payer: Self-pay

## 2021-07-08 ENCOUNTER — Encounter (HOSPITAL_BASED_OUTPATIENT_CLINIC_OR_DEPARTMENT_OTHER): Payer: Self-pay | Admitting: Orthopaedic Surgery

## 2021-07-08 NOTE — Telephone Encounter (Signed)
Client called needing transportation to [facility] on 07/14/21 at 1PM for pre-op workup for surgery on 07/20/21  [facility] called and arranged. Also confirmed with client that she has transportation scheduled for 07/12/21 to [facility] for 1PM appt.  [place] [employer]

## 2021-07-09 NOTE — Progress Notes (Signed)
Reviewed case with Dr. Marcie Bal at [facility]. Will evaluate patient DOS. Pt verbalized understanding case could be cancelled due to cocaine use.

## 2021-07-09 NOTE — Congregational Nurse Program (Signed)
°  Dept: [phone]   Congregational Nurse Program Note  Date of Encounter: 07/09/2021  Past Medical History: Past Medical History:  Diagnosis Date   Gout    Hypertension    Kidney infection    Migraine    MRSA infection     Encounter Details:  CNP Questionnaire - 07/07/21 1245       Questionnaire   Do you give verbal consent to treat you today? Yes    Location Patient Film/video editor, [employer] or Organization    Patient Status Unknown    Insurance Uninsured ([employer]/[facility]s/Self-Pay)    Insurance Referral N/A    Medication Have Medication Insecurities    Medical Provider Yes    Screening Referrals N/A    Medical Referral Health Department    Medical Appointment [place] N/A    Transportation N/A    Housing/Utilities N/A    Interpersonal Safety N/A    Intervention Support;Blood pressure;Advocate    ED Visit Averted N/A    Life-Saving Intervention Made N/A           Came in today requesting need to be seen at [facility] for severe pain left leg and medication renewal assistance Told will call and schedule appointment and let  her know the date and time. BP 131/80- P 85  2pm TCT [facility] spoke with Katharine Look appointment scheduled for Monday 1 PM. TCT Clarene Critchley and informed her of appointment Erma Heritage RN

## 2021-07-14 ENCOUNTER — Encounter (HOSPITAL_BASED_OUTPATIENT_CLINIC_OR_DEPARTMENT_OTHER)
Admission: RE | Admit: 2021-07-14 | Discharge: 2021-07-14 | Disposition: A | Discharge status: Home / Self Care | Payer: Medicaid Other | Source: Ambulatory Visit | Attending: Orthopaedic Surgery | Admitting: Orthopaedic Surgery

## 2021-07-14 DIAGNOSIS — Z01812 Encounter for preprocedural laboratory examination: Secondary | ICD-10-CM | POA: Insufficient documentation

## 2021-07-14 LAB — BASIC METABOLIC PANEL
Anion gap: 10 (ref 5–15)
BUN: 8 mg/dL (ref 6–20)
CO2: 26 mmol/L (ref 22–32)
Calcium: 9.4 mg/dL (ref 8.9–10.3)
Chloride: 101 mmol/L (ref 98–111)
Creatinine, Ser: 0.79 mg/dL (ref 0.44–1.00)
GFR, Estimated: >60 mL/min (ref >60)
Glucose, Bld: 96 mg/dL (ref 70–99)
Potassium: 4.2 mmol/L (ref 3.5–5.1)
Sodium: 137 mmol/L (ref 135–145)

## 2021-07-15 NOTE — H&P (Signed)
Joanna Perry is an 51 y.o. female.   Chief Complaint: Painful trigger finger left long finger . Painful trigger finger Right Thumb HPI: Patient presents today for recurrent left middle and right thumb trigger fingers. She was here last in March of this year and had both injected with cortisone. She is wanting to discuss her treatment options today. She said that the injections did help, but the triggering and pain started again a month ago. She is right hand dominant. She takes Ibuprofen as needed.    Past Medical History:  Diagnosis Date   Gout    Hypertension    Kidney infection    Migraine    MRSA infection     Past Surgical History:  Procedure Laterality Date   left leg     MRSA    History reviewed. No pertinent family history. Social History:  reports that she has never smoked. She has never used smokeless tobacco. She reports that she does not drink alcohol and does not use drugs.  Allergies:  Allergies  Allergen Reactions   Codeine Itching   Hydrocodone-Acetaminophen Itching    Can take Oxycodone/Percocet   Other Swelling    pecans   Peach [Prunus Persica] Hives   Strawberry Extract Hives   Toradol [Ketorolac Tromethamine] Itching   Tramadol Itching and Hives   Latex Rash    No medications prior to admission.    Results for orders placed or performed during the hospital encounter of 07/20/21 (from the past 48 hour(s))  Basic metabolic panel per protocol     Status: None   Collection Time: 07/14/21  3:28 PM  Result Value Ref Range   Sodium 137 135 - 145 mmol/L   Potassium 4.2 3.5 - 5.1 mmol/L   Chloride 101 98 - 111 mmol/L   CO2 26 22 - 32 mmol/L   Glucose, Bld 96 70 - 99 mg/dL    Comment: Glucose reference range applies only to samples taken after fasting for at least 8 hours.   BUN 8 6 - 20 mg/dL   Creatinine, Ser 0.79 0.44 - 1.00 mg/dL   Calcium 9.4 8.9 - 10.3 mg/dL   GFR, Estimated >60 >60 mL/min    Comment: (NOTE) Calculated using the CKD-EPI  Creatinine Equation (2021)    Anion gap 10 5 - 15    Comment: Performed at [facility] [address], [place], [place] [address]   No results found.  Review of Systems  All other systems reviewed and are negative.  Height 5\' 1"  (1.549 m), weight 77.1 kg, last menstrual period 01/01/2015. Physical Exam  Constitutional:      Appearance: She is well-developed.  Eyes:     Pupils: Pupils are equal, round, and reactive to light.  Pulmonary:     Effort: Pulmonary effort is normal.  Skin:    General: Skin is warm and dry.  Neurological:     Mental Status: She is alert and oriented to person, place, and time.  Psychiatric:        Behavior: Behavior normal.   Heart RRR Lungs Clear   Ortho Exam active triggering of right thumb and left long finger with palpable nodules over the A1 pulley.  Neurovascular exam intact. Assessment/Plan   Visit Diagnoses:  1. Trigger finger, left middle finger   2. Trigger finger of right thumb       Plan: Recurrent triggering of right thumb and left long finger.  Long discussion regarding treatment options and, specifically, surgical  release.  Risks benefits recovery discussed. Patient wishes to move forward with surgery  Bevely Palmer Tacori Kvamme, PA 07/15/2021, 2:30 PM

## 2021-07-15 NOTE — H&P (View-Only) (Signed)
Could perform the surgery under local with IV sedation

## 2021-07-15 NOTE — Progress Notes (Signed)
Could perform the surgery under local with IV sedation

## 2021-07-20 ENCOUNTER — Ambulatory Visit (HOSPITAL_BASED_OUTPATIENT_CLINIC_OR_DEPARTMENT_OTHER)
Admission: RE | Admit: 2021-07-20 | Discharge: 2021-07-20 | Disposition: A | Discharge status: Home / Self Care | Payer: Self-pay | Attending: Orthopaedic Surgery | Admitting: Orthopaedic Surgery

## 2021-07-20 ENCOUNTER — Ambulatory Visit (HOSPITAL_BASED_OUTPATIENT_CLINIC_OR_DEPARTMENT_OTHER): Payer: Self-pay | Admitting: Certified Registered"

## 2021-07-20 ENCOUNTER — Other Ambulatory Visit: Payer: Self-pay

## 2021-07-20 ENCOUNTER — Encounter (HOSPITAL_BASED_OUTPATIENT_CLINIC_OR_DEPARTMENT_OTHER)
Admission: RE | Disposition: A | Discharge status: Home / Self Care | Payer: Self-pay | Source: Home / Self Care | Attending: Orthopaedic Surgery

## 2021-07-20 ENCOUNTER — Encounter (HOSPITAL_BASED_OUTPATIENT_CLINIC_OR_DEPARTMENT_OTHER): Payer: Self-pay | Admitting: Orthopaedic Surgery

## 2021-07-20 DIAGNOSIS — M65332 Trigger finger, left middle finger: Secondary | ICD-10-CM

## 2021-07-20 DIAGNOSIS — I10 Essential (primary) hypertension: Secondary | ICD-10-CM

## 2021-07-20 DIAGNOSIS — M65311 Trigger thumb, right thumb: Secondary | ICD-10-CM

## 2021-07-20 DIAGNOSIS — F1490 Cocaine use, unspecified, uncomplicated: Secondary | ICD-10-CM | POA: Insufficient documentation

## 2021-07-20 DIAGNOSIS — Z8614 Personal history of Methicillin resistant Staphylococcus aureus infection: Secondary | ICD-10-CM | POA: Insufficient documentation

## 2021-07-20 HISTORY — PX: TRIGGER FINGER RELEASE: SHX641

## 2021-07-20 SURGERY — RELEASE, A1 PULLEY, FOR TRIGGER FINGER
Anesthesia: Monitor Anesthesia Care | Site: Hand | Laterality: Bilateral

## 2021-07-20 MED ORDER — BUPIVACAINE HCL (PF) 0.25 % IJ SOLN
INTRAMUSCULAR | Status: AC
  Filled 2021-07-20: qty 30

## 2021-07-20 MED ORDER — ONDANSETRON HCL 4 MG/2ML IJ SOLN
INTRAMUSCULAR | Status: DC | PRN
  Administered 2021-07-20: 4 mg via INTRAVENOUS

## 2021-07-20 MED ORDER — LIDOCAINE 2% (20 MG/ML) 5 ML SYRINGE
INTRAMUSCULAR | Status: AC
  Filled 2021-07-20: qty 5

## 2021-07-20 MED ORDER — PROPOFOL 500 MG/50ML IV EMUL
INTRAVENOUS | Status: DC | PRN
  Administered 2021-07-20: 40 ug/kg/min via INTRAVENOUS

## 2021-07-20 MED ORDER — DEXAMETHASONE SODIUM PHOSPHATE 10 MG/ML IJ SOLN
INTRAMUSCULAR | Status: AC
  Filled 2021-07-20: qty 1

## 2021-07-20 MED ORDER — 0.9 % SODIUM CHLORIDE (POUR BTL) OPTIME
TOPICAL | Status: DC | PRN
  Administered 2021-07-20: 200 mL

## 2021-07-20 MED ORDER — HYDROMORPHONE HCL 1 MG/ML IJ SOLN: 0.2500 mg | INTRAMUSCULAR | Status: DC | PRN

## 2021-07-20 MED ORDER — FENTANYL CITRATE (PF) 100 MCG/2ML IJ SOLN
INTRAMUSCULAR | Status: AC
  Filled 2021-07-20: qty 2

## 2021-07-20 MED ORDER — DEXMEDETOMIDINE HCL IN NACL 80 MCG/20ML IV SOLN
INTRAVENOUS | Status: AC
  Filled 2021-07-20: qty 20

## 2021-07-20 MED ORDER — MIDAZOLAM HCL 2 MG/2ML IJ SOLN
INTRAMUSCULAR | Status: AC
  Filled 2021-07-20: qty 2

## 2021-07-20 MED ORDER — ONDANSETRON HCL 4 MG/2ML IJ SOLN
INTRAMUSCULAR | Status: AC
  Filled 2021-07-20: qty 2

## 2021-07-20 MED ORDER — OXYCODONE HCL 5 MG/5ML PO SOLN: 5.0000 mg | Freq: Once | ORAL | Status: DC | PRN

## 2021-07-20 MED ORDER — PROPOFOL 10 MG/ML IV BOLUS
INTRAVENOUS | Status: DC | PRN
  Administered 2021-07-20: 30 mg via INTRAVENOUS

## 2021-07-20 MED ORDER — PROPOFOL 500 MG/50ML IV EMUL
INTRAVENOUS | Status: AC
  Filled 2021-07-20: qty 50

## 2021-07-20 MED ORDER — LACTATED RINGERS IV SOLN: INTRAVENOUS | Status: DC

## 2021-07-20 MED ORDER — LIDOCAINE HCL (PF) 1 % IJ SOLN
INTRAMUSCULAR | Status: AC
  Filled 2021-07-20: qty 30

## 2021-07-20 MED ORDER — CEFAZOLIN SODIUM-DEXTROSE 2-4 GM/100ML-% IV SOLN
2.0000 g | INTRAVENOUS | Status: AC
  Administered 2021-07-20: 2 g via INTRAVENOUS

## 2021-07-20 MED ORDER — FENTANYL CITRATE (PF) 100 MCG/2ML IJ SOLN
INTRAMUSCULAR | Status: DC | PRN
  Administered 2021-07-20 (×2): 50 ug via INTRAVENOUS

## 2021-07-20 MED ORDER — OXYCODONE HCL 5 MG PO TABS: 5.0000 mg | ORAL_TABLET | Freq: Once | ORAL | Status: DC | PRN

## 2021-07-20 MED ORDER — MIDAZOLAM HCL 5 MG/5ML IJ SOLN
INTRAMUSCULAR | Status: DC | PRN
  Administered 2021-07-20: 2 mg via INTRAVENOUS

## 2021-07-20 MED ORDER — OXYCODONE HCL 5 MG PO TABS
5.0000 mg | ORAL_TABLET | ORAL | 0 refills | Status: AC | PRN
Start: 2021-07-20 — End: ?

## 2021-07-20 MED ORDER — PROMETHAZINE HCL 25 MG/ML IJ SOLN: 6.2500 mg | INTRAMUSCULAR | Status: DC | PRN

## 2021-07-20 MED ORDER — CEFAZOLIN SODIUM-DEXTROSE 2-4 GM/100ML-% IV SOLN
INTRAVENOUS | Status: AC
  Filled 2021-07-20: qty 100

## 2021-07-20 MED ORDER — LIDOCAINE HCL 1 % IJ SOLN
INTRAMUSCULAR | Status: DC | PRN
  Administered 2021-07-20: 4 mL via SUBCUTANEOUS

## 2021-07-20 SURGICAL SUPPLY — 51 items
BLADE SURG 15 STRL LF DISP TIS (BLADE) ×1 IMPLANT
BLADE SURG 15 STRL SS (BLADE) ×4
BNDG CMPR 5X2 CHSV 1 LYR STRL (GAUZE/BANDAGES/DRESSINGS) ×2
BNDG CMPR 9X4 STRL LF SNTH (GAUZE/BANDAGES/DRESSINGS) ×1
BNDG COHESIVE 2X5 TAN ST LF (GAUZE/BANDAGES/DRESSINGS) ×2 IMPLANT
BNDG ELASTIC 3X5.8 VLCR STR LF (GAUZE/BANDAGES/DRESSINGS) IMPLANT
BNDG ESMARK 4X9 LF (GAUZE/BANDAGES/DRESSINGS) ×2 IMPLANT
CANISTER SUCT 1200ML W/VALVE (MISCELLANEOUS) IMPLANT
CORD BIPOLAR FORCEPS 12FT (ELECTRODE) ×1 IMPLANT
COVER BACK TABLE 60X90IN (DRAPES) ×2 IMPLANT
COVER MAYO STAND STRL (DRAPES) ×3 IMPLANT
CUFF TOURN SGL QUICK 18X4 (TOURNIQUET CUFF) ×1 IMPLANT
DRAPE EXTREMITY T 121X128X90 (DISPOSABLE) ×3 IMPLANT
DRAPE SURG 17X23 STRL (DRAPES) ×1 IMPLANT
DRSG EMULSION OIL 3X3 NADH (GAUZE/BANDAGES/DRESSINGS) ×2 IMPLANT
DRSG PAD ABDOMINAL 8X10 ST (GAUZE/BANDAGES/DRESSINGS) ×3 IMPLANT
DURAPREP 26ML APPLICATOR (WOUND CARE) ×3 IMPLANT
ELECT NDL TIP 2.8 STRL (NEEDLE) ×1 IMPLANT
ELECT NEEDLE TIP 2.8 STRL (NEEDLE) IMPLANT
ELECT REM PT RETURN 9FT ADLT (ELECTROSURGICAL) ×2
ELECTRODE REM PT RTRN 9FT ADLT (ELECTROSURGICAL) ×1 IMPLANT
GAUZE SPONGE 4X4 12PLY STRL (GAUZE/BANDAGES/DRESSINGS) ×2 IMPLANT
GLOVE SRG 8 PF TXTR STRL LF DI (GLOVE) ×1 IMPLANT
GLOVE SURG LTX SZ8 (GLOVE) ×1 IMPLANT
GLOVE SURG POLYISO LF SZ7 (GLOVE) ×1 IMPLANT
GLOVE SURG SYN 8.0 (GLOVE) ×4 IMPLANT
GLOVE SURG SYN 8.0 PF PI (GLOVE) IMPLANT
GLOVE SURG UNDER POLY LF SZ7 (GLOVE) ×3 IMPLANT
GLOVE SURG UNDER POLY LF SZ8 (GLOVE) ×2
GOWN STRL REUS W/ TWL LRG LVL3 (GOWN DISPOSABLE) ×1 IMPLANT
GOWN STRL REUS W/ TWL XL LVL3 (GOWN DISPOSABLE) IMPLANT
GOWN STRL REUS W/TWL LRG LVL3 (GOWN DISPOSABLE) ×4
GOWN STRL REUS W/TWL XL LVL3 (GOWN DISPOSABLE) ×2
NDL HYPO 25X1 1.5 SAFETY (NEEDLE) IMPLANT
NEEDLE HYPO 25X1 1.5 SAFETY (NEEDLE) ×2 IMPLANT
NS IRRIG 1000ML POUR BTL (IV SOLUTION) ×2 IMPLANT
PACK BASIN DAY SURGERY FS (CUSTOM PROCEDURE TRAY) ×2 IMPLANT
PAD CAST 3X4 CTTN HI CHSV (CAST SUPPLIES) ×1 IMPLANT
PADDING CAST ABS 3INX4YD NS (CAST SUPPLIES) ×1
PADDING CAST ABS COTTON 3X4 (CAST SUPPLIES) ×1 IMPLANT
PADDING CAST COTTON 3X4 STRL (CAST SUPPLIES) ×4
PENCIL SMOKE EVACUATOR (MISCELLANEOUS) ×1 IMPLANT
STOCKINETTE 4X48 STRL (DRAPES) ×3 IMPLANT
SUCTION FRAZIER HANDLE 10FR (MISCELLANEOUS) ×2
SUCTION TUBE FRAZIER 10FR DISP (MISCELLANEOUS) IMPLANT
SUT ETHILON 4 0 PS 2 18 (SUTURE) ×2 IMPLANT
SYR BULB EAR ULCER 3OZ GRN STR (SYRINGE) ×2 IMPLANT
SYR CONTROL 10ML LL (SYRINGE) ×1 IMPLANT
TOWEL GREEN STERILE FF (TOWEL DISPOSABLE) ×2 IMPLANT
TUBE CONNECTING 20X1/4 (TUBING) ×1 IMPLANT
UNDERPAD 30X36 HEAVY ABSORB (UNDERPADS AND DIAPERS) ×1 IMPLANT

## 2021-07-20 NOTE — Discharge Instructions (Addendum)
Keep hands clean and dry, dressing to remain in place. Schedule appointment with Dr. Durward Fortes in 1 week.   Post Anesthesia Home Care Instructions  Activity: Get plenty of rest for the remainder of the day. A responsible individual must stay with you for 24 hours following the procedure.  For the next 24 hours, DO NOT: -Drive a car -Paediatric nurse -Drink alcoholic beverages -Take any medication unless instructed by your physician -Make any legal decisions or sign important papers.  Meals: Start with liquid foods such as gelatin or soup. Progress to regular foods as tolerated. Avoid greasy, spicy, heavy foods. If nausea and/or vomiting occur, drink only clear liquids until the nausea and/or vomiting subsides. Call your physician if vomiting continues.  Special Instructions/Symptoms: Your throat may feel dry or sore from the anesthesia or the breathing tube placed in your throat during surgery. If this causes discomfort, gargle with warm salt water. The discomfort should disappear within 24 hours.  If you had a scopolamine patch placed behind your ear for the management of post- operative nausea and/or vomiting:  1. The medication in the patch is effective for 72 hours, after which it should be removed.  Wrap patch in a tissue and discard in the trash. Wash hands thoroughly with soap and water. 2. You may remove the patch earlier than 72 hours if you experience unpleasant side effects which may include dry mouth, dizziness or visual disturbances. 3. Avoid touching the patch. Wash your hands with soap and water after contact with the patch.

## 2021-07-20 NOTE — Op Note (Signed)
NAME: MERRY, POND MEDICAL RECORD NO: [id number] ACCOUNT NO: [id number] DATE OF BIRTH: Dec 16, 1970 FACILITY: [facility] LOCATION: [facility] PHYSICIAN: Vonna Kotyk. Durward Fortes, MD  Operative Report   DATE OF PROCEDURE: 07/20/2021  PREOPERATIVE DIAGNOSES: 1.  Right trigger thumb. 2.  Left long trigger finger.  POSTOPERATIVE DIAGNOSES: 1.  Right trigger thumb. 2.  Left long trigger finger.  PROCEDURE:  Release of A1 pulley, right thumb and left long finger.  SURGEON:  Vonna Kotyk. Durward Fortes, MD  ASSISTANT:  Biagio Borg, PA-C  ANESTHESIA:  IV sedation and local 0.25% Marcaine and 1% Xylocaine with epinephrine.  COMPLICATIONS:  None.  HISTORY:  A 52 year old female has had persistent triggering of the above two fingers for well over a year to the point of compromise.  We discussed surgical release and she would like to proceed.  DESCRIPTION OF PROCEDURE:  The patient was met in the holding area, identified the right thumb and left long finger as the appropriate operative sites and marked them accordingly.  Any questions were answered.  The patient was then transferred to room  #6.  Under IV sedation, the right and left hands were prepped with DuraPrep and then prepped in the usual sterile manner.  Timeout was called.  Initial procedure was performed on the right thumb.  Infiltrated an area transversely at the thumb flexion crease space.  Then, via sharp dissection, a skin incision was made with a 15 blade knife.  Then, via blunt dissection with a blunt tip scissors  longitudinally, the soft tissue was elevated from the flexor tendon sheath and A1 pulley.  There was a transverse band that was quite tight and obviously the cause of the triggering.  Under direct visualization, this was released.  There was no further  compression.  The wound was irrigated with saline solution and the skin closed with a 4-0 Ethilon.  Sterile bulky dressing was applied followed by a 2-inch Coban.  The left  hand was also prepped with DuraPrep and then draped in the usual sterile manner.  Again, timeout was called.  We infiltrated the transverse distal flexion crease of the long finger and then I made about a half-inch incision.  Via sharp  dissection, the incision was carried down the subcutaneous tissue.  Soft tissue was elevated off the flexor tendon sheath.  Blunt retractors were then inserted.  There was an area of constriction of the A1 pulley and thickening of the same that was  incised longitudinally involving the A1 pulley.  At that point, there was no further impingement or catching.  We carefully protected the neurovascular bundles.  The wound was irrigated with saline solution.  The skin closed with 4-0 Ethilon.  Sterile  bulky dressing was applied with a 2-inch Coban as the outer covering.  The patient tolerated the procedure without complications.  Oxycodone 5/325 every 6 hours as needed for pain.  Office one week.      PAA D: 07/20/2021 8:34:56 am T: 07/20/2021 8:54:00 am  JOB: [id number]/ [id number]

## 2021-07-20 NOTE — Transfer of Care (Signed)
Immediate Anesthesia Transfer of Care Note  Patient: Joanna Perry  Procedure(s) Performed: Right thumb trigger finger release, Left long finger trigger release (Bilateral: Hand)  Patient Location: PACU  Anesthesia Type:MAC  Level of Consciousness: awake  Airway & Oxygen Therapy: Patient Spontanous Breathing and Patient connected to face mask oxygen  Post-op Assessment: Report given to RN and Post -op Vital signs reviewed and stable  Post vital signs: Reviewed and stable  Last Vitals:  Vitals Value Taken Time  BP    Temp    Pulse    Resp    SpO2      Last Pain:  Vitals:   07/20/21 0635  TempSrc: Oral  PainSc: 2          Complications: No notable events documented.

## 2021-07-20 NOTE — Interval H&P Note (Signed)
History and Physical Interval Note:  07/20/2021 7:34 AM  Joanna Perry  has presented today for surgery, with the diagnosis of right trigger thumb, left long trigger finger.  The various methods of treatment have been discussed with the patient and family. After consideration of risks, benefits and other options for treatment, the patient has consented to  Procedure(s): Right thumb trigger finger release, Left long finger trigger release (Bilateral) as a surgical intervention.  The patient's history has been reviewed, patient examined, no change in status, stable for surgery.  I have reviewed the patient's chart and labs.  Questions were answered to the patient's satisfaction.     Garald Balding

## 2021-07-20 NOTE — Anesthesia Postprocedure Evaluation (Signed)
Anesthesia Post Note  Patient: Joanna Perry  Procedure(s) Performed: Right thumb trigger finger release, Left long finger trigger release (Bilateral: Hand)     Patient location during evaluation: PACU Anesthesia Type: MAC Level of consciousness: awake and alert Pain management: pain level controlled Vital Signs Assessment: post-procedure vital signs reviewed and stable Respiratory status: spontaneous breathing, nonlabored ventilation and respiratory function stable Cardiovascular status: blood pressure returned to baseline and stable Postop Assessment: no apparent nausea or vomiting Anesthetic complications: no   No notable events documented.  Last Vitals:  Vitals:   07/20/21 0850 07/20/21 0905  BP: (!) 127/95 (!) 142/100  Pulse: 67 74  Resp: 18 18  Temp:  (!) 36.2 C  SpO2: 97% 98%    Last Pain:  Vitals:   07/20/21 0905  TempSrc: Oral  PainSc: 0-No pain                 Lynda Rainwater

## 2021-07-20 NOTE — Anesthesia Preprocedure Evaluation (Addendum)
Anesthesia Evaluation  Patient identified by MRN, date of birth, ID band Patient awake    Reviewed: Allergy & Precautions, NPO status , Patient's Chart, lab work & pertinent test results  Airway Mallampati: II  TM Distance: >3 FB Neck ROM: Full    Dental  (+) Edentulous Upper   Pulmonary neg pulmonary ROS,    Pulmonary exam normal breath sounds clear to auscultation       Cardiovascular hypertension, Pt. on medications negative cardio ROS Normal cardiovascular exam Rhythm:Regular Rate:Normal     Neuro/Psych  Headaches, negative psych ROS   GI/Hepatic negative GI ROS, (+)     substance abuse  cocaine use,   Endo/Other  negative endocrine ROS  Renal/GU negative Renal ROS  negative genitourinary   Musculoskeletal negative musculoskeletal ROS (+)   Abdominal (+) + obese,   Peds negative pediatric ROS (+)  Hematology negative hematology ROS (+)   Anesthesia Other Findings   Reproductive/Obstetrics negative OB ROS                            Anesthesia Physical Anesthesia Plan  ASA: 3  Anesthesia Plan: MAC   Post-op Pain Management: Minimal or no pain anticipated   Induction: Intravenous  PONV Risk Score and Plan: 2 and Ondansetron, Midazolam and Treatment may vary due to age or medical condition  Airway Management Planned: Simple Face Mask  Additional Equipment:   Intra-op Plan:   Post-operative Plan:   Informed Consent: I have reviewed the patients History and Physical, chart, labs and discussed the procedure including the risks, benefits and alternatives for the proposed anesthesia with the patient or authorized representative who has indicated his/her understanding and acceptance.     Dental advisory given  Plan Discussed with: CRNA  Anesthesia Plan Comments:         Anesthesia Quick Evaluation

## 2021-07-20 NOTE — Op Note (Signed)
PATIENT ID:      Joanna Perry  MRN:     [id number] DOB/AGE:    [birth year] / 52 y.o.       OPERATIVE REPORT    DATE OF PROCEDURE:  07/20/2021       PREOPERATIVE DIAGNOSIS:   right trigger thumb, left long trigger finger                                                       Estimated body mass index is 33.37 kg/m as calculated from the following:   Height as of this encounter: 5\' 1"  (1.549 m).   Weight as of this encounter: 80.1 kg.     POSTOPERATIVE DIAGNOSIS:   right trigger thumb, left long trigger finger                                                                     Estimated body mass index is 33.37 kg/m as calculated from the following:   Height as of this encounter: 5\' 1"  (1.549 m).   Weight as of this encounter: 80.1 kg.     PROCEDURE:  Procedure(s): Right thumb trigger finger release, Left long finger trigger release      SURGEON:  Joni Fears, MD    ASSISTANT:   Biagio Borg, PA-C   (Present and scrubbed throughout the case, critical for assistance with exposure, retraction, instrumentation, and closure.)          ANESTHESIA: local and IV sedation     DRAINS: none :      TOURNIQUET TIME: * Missing tourniquet times found for documented tourniquets in log: [id number] *    COMPLICATIONS:  None   CONDITION:  stable  PROCEDURE IN DETAIL: [id number]   Joanna Perry 07/20/2021, 8:22 AM

## 2021-07-20 NOTE — Interval H&P Note (Signed)
History and Physical Interval Note:  07/20/2021 7:32 AM  Joanna Perry  has presented today for surgery, with the diagnosis of right trigger thumb, left long trigger finger.  The various methods of treatment have been discussed with the patient and family. After consideration of risks, benefits and other options for treatment, the patient has consented to  Procedure(s): Right thumb trigger finger release, Left long finger trigger release (Bilateral) as a surgical intervention.  The patient's history has been reviewed, patient examined, no change in status, stable for surgery.  I have reviewed the patient's chart and labs.  Questions were answered to the patient's satisfaction.     Garald Balding

## 2021-07-21 ENCOUNTER — Encounter (HOSPITAL_BASED_OUTPATIENT_CLINIC_OR_DEPARTMENT_OTHER): Payer: Self-pay | Admitting: Orthopaedic Surgery

## 2021-07-28 ENCOUNTER — Encounter: Payer: Medicaid Other | Admitting: Orthopaedic Surgery

## 2021-08-04 ENCOUNTER — Encounter: Payer: Self-pay | Admitting: Orthopaedic Surgery

## 2021-08-04 ENCOUNTER — Ambulatory Visit (INDEPENDENT_AMBULATORY_CARE_PROVIDER_SITE_OTHER): Payer: Self-pay | Admitting: Orthopaedic Surgery

## 2021-08-04 ENCOUNTER — Other Ambulatory Visit: Payer: Self-pay

## 2021-08-04 DIAGNOSIS — M65311 Trigger thumb, right thumb: Secondary | ICD-10-CM

## 2021-08-04 DIAGNOSIS — M65332 Trigger finger, left middle finger: Secondary | ICD-10-CM

## 2021-08-04 NOTE — Progress Notes (Signed)
? ?Office Visit Note ?  ?Patient: Joanna Perry           ?Date of Birth: Jun 21, 1970           ?MRN: [id number] ?Visit Date: 08/04/2021 ?             ?Requested by: No referring provider defined for this encounter. ?PCP: System, Provider Not In ? ? ?Assessment & Plan: ?Visit Diagnoses:  ?1. Trigger finger, left middle finger   ?2. Trigger finger of right thumb   ? ? ?Plan: 15 days status post release of left long finger and right thumb triggering.  Doing well.  We will be involved in a drug therapy program for 5 months somewhere in [place] and will not be returning until then.  I remove the stitches.  No longer triggering.  The wounds look fine she needs to work on range of motion she has a little loss of extension across the long finger.  Neurologically intact ?Follow-Up Instructions: Return in about 2 weeks (around 08/18/2021).  ? ?Orders:  ?No orders of the defined types were placed in this encounter. ? ?No orders of the defined types were placed in this encounter. ? ? ? ? Procedures: ?No procedures performed ? ? ?Clinical Data: ?No additional findings. ? ? ?Subjective: ?Chief Complaint  ?Patient presents with  ? Right Hand - Follow-up  ?  Thumb trigger finger release 07/20/2021  ? Left Hand - Follow-up  ?  Left long finger trigger finger release 07/20/2021  ?Patient presents today for follow up on bilateral hands. She had a right thumb and left long trigger finger release on 07/20/2021. She is now 2weeks out from surgery. She said that her left hand hurts, but the right hand is doing well. She has ran out of pain medicine.  ? ?HPI ? ?Review of Systems ? ? ?Objective: ?Vital Signs: LMP 01/01/2015  ? ?Physical Exam ? ?Ortho Exam right thumb and left long finger trigger incisions healing without problem.  Stitches removed.  Able to flex the IP joint of the right thumb at least 90 degrees and good opposition of thumb little finger neurologically intact.  A little loss of full extension across the PIP joint of the  left long finger but passively I can fully extend at.  Neurologically intact.  Able to make a full fist touch the tips of her fingers and the palm of her hand ? ?Specialty Comments:  ?No specialty comments available. ? ?Imaging: ?No results found. ? ? ?PMFS History: ?Patient Active Problem List  ? Diagnosis Date Noted  ? Trigger finger, left middle finger 08/05/2020  ? Trigger finger of right thumb 08/05/2020  ? Unspecified fracture of shaft of left ulna, subsequent encounter for closed fracture with delayed healing 03/15/2017  ? Closed fracture of right forearm 01/11/2017  ? ?Past Medical History:  ?Diagnosis Date  ? Gout   ? Hypertension   ? Kidney infection   ? Migraine   ? MRSA infection   ?  ?History reviewed. No pertinent family history.  ?Past Surgical History:  ?Procedure Laterality Date  ? left leg    ? MRSA  ? TRIGGER FINGER RELEASE Bilateral 07/20/2021  ? Procedure: Right thumb trigger finger release, Left long finger trigger release;  Surgeon: Garald Balding, MD;  Location: [facility];  Service: Orthopedics;  Laterality: Bilateral;  ? ?Social History  ? ?Occupational History  ? Not on file  ?Tobacco Use  ? Smoking status: Never  ? Smokeless  tobacco: Never  ?Vaping Use  ? Vaping Use: Never used  ?Substance and Sexual Activity  ? Alcohol use: No  ?  Comment: once a year  ? Drug use: No  ?  Types: Cocaine  ? Sexual activity: Yes  ?  Birth control/protection: Implant  ? ? ? ? ? ? ?

## 2021-08-29 DIAGNOSIS — Z87898 Personal history of other specified conditions: Secondary | ICD-10-CM | POA: Insufficient documentation

## 2021-08-29 DIAGNOSIS — F1420 Cocaine dependence, uncomplicated: Secondary | ICD-10-CM | POA: Insufficient documentation

## 2021-08-29 DIAGNOSIS — I10 Essential (primary) hypertension: Secondary | ICD-10-CM | POA: Insufficient documentation

## 2021-09-29 ENCOUNTER — Ambulatory Visit: Payer: Self-pay | Admitting: Orthopaedic Surgery

## 2021-11-26 ENCOUNTER — Telehealth: Payer: Self-pay

## 2021-11-26 NOTE — Telephone Encounter (Signed)
[facility] Client called stating she had missed her appointment on 11/05/21 to [facility]. She states she needs a new appointment and a CN program voucher.  Discussed in detail that as [facility] I do not issue those CN program vouchers that she has been working with Erma Heritage RN CN at the [employer] in [place] for that assistance. I did with discussion from Erma Heritage issue the 11/05/21 appointment voucher as she was unavailable, but that Client needs to follow up in person with [facility] for her to continue to have assistance. Explained to Client that for Terrence Dupont to document and follow up she needs to meet with her in person.  She states understanding. States she will Call Erma Heritage RN to set up a time to see her.   Note: client wishes to leave [place] [place] [employer] as her mailing address only, but currently she is staying with a friend on [address] in [place] that is further than walking distance to [employer].   Discussed that [facility] can assist with transportation to her [facility] appointment if needed when she has that date and time. Did discuss that she needs to call us at least 24-48 hours prior to (business days) in order to arrange.  Client states she was seen again recently in the [facility] Emergency room, stating " I was so sick" and I need to be seen [facility] for follow up. She states she will contact Erma Heritage RN CNP .  Client ended call abruptly stating she had "another call coming in, I have to go" call ended.  [place] [employer]

## 2021-12-01 ENCOUNTER — Telehealth: Payer: Self-pay

## 2021-12-01 NOTE — Telephone Encounter (Signed)
Returned call to client who needs transportation to [employer] for 12/08/21 at [place].  Will arrange with [employer] tomorrow and client is aware, will notify client once it is arranged. Updated phone in [place].  [place] [employer]

## 2021-12-02 ENCOUNTER — Telehealth: Payer: Self-pay

## 2021-12-02 NOTE — Telephone Encounter (Signed)
Transportation scheduled for 12/08/21 at 1pm to [employer] on [address], [place] up from [address], [place].  Client notified she will get a text and call about pick up time.  Client also states she needs MedAssist renewed for Dr [employer]. Scheduled Home visit for 12/07/21 at 2:30pm.  Discussed with client in detail that Gabapentin is not available at [facility]. Some of her other medications such as cymbalta are. She states understanding.    [place] [employer]

## 2021-12-08 ENCOUNTER — Ambulatory Visit: Payer: Medicaid Other | Admitting: Orthopedic Surgery

## 2021-12-08 ENCOUNTER — Telehealth: Payer: Self-pay

## 2021-12-08 NOTE — Telephone Encounter (Signed)
Client had called main line stating her transportation did not come for her appt today at [employer] in [place].  This RN Scheduled transportation with [place] on 12/02/21 and confirmed. Client states she has rescheduled for July 19 at 2:15 PM This RN Darryll Capers and spoke to operator , they stated they attempted to reach client x 2 on 12/07/21 to let her know they would pick her up at 12:15 today. Cancelled todays pick up as client has rescheduled.  Attempted to call client and she did not answer, left message for her to return my call.   [place] [employer]

## 2021-12-14 ENCOUNTER — Telehealth: Payer: Self-pay

## 2021-12-14 NOTE — Telephone Encounter (Signed)
Client called wanting to know who scheduled her an appointment with Dr Luna Glasgow in [place]. Reviewed EPic and appt is in [place] for 12/15/21 at [facility] and transportation is scheduled with [place]. Reached out to [place] by to determine pick up time.   Will let client know pick up time once I hear from Kermit Gunn/[facility]

## 2021-12-15 ENCOUNTER — Ambulatory Visit (INDEPENDENT_AMBULATORY_CARE_PROVIDER_SITE_OTHER): Payer: Self-pay | Admitting: Orthopedic Surgery

## 2021-12-15 ENCOUNTER — Ambulatory Visit (INDEPENDENT_AMBULATORY_CARE_PROVIDER_SITE_OTHER): Payer: Self-pay

## 2021-12-15 ENCOUNTER — Encounter: Payer: Self-pay | Admitting: Orthopedic Surgery

## 2021-12-15 VITALS — Ht 61.0 in | Wt 176.0 lb

## 2021-12-15 DIAGNOSIS — M546 Pain in thoracic spine: Secondary | ICD-10-CM

## 2021-12-15 DIAGNOSIS — F419 Anxiety disorder, unspecified: Secondary | ICD-10-CM | POA: Insufficient documentation

## 2021-12-15 DIAGNOSIS — R079 Chest pain, unspecified: Secondary | ICD-10-CM | POA: Insufficient documentation

## 2021-12-15 DIAGNOSIS — Z6831 Body mass index (BMI) 31.0-31.9, adult: Secondary | ICD-10-CM | POA: Insufficient documentation

## 2021-12-15 DIAGNOSIS — F332 Major depressive disorder, recurrent severe without psychotic features: Secondary | ICD-10-CM | POA: Insufficient documentation

## 2021-12-15 DIAGNOSIS — Z79899 Other long term (current) drug therapy: Secondary | ICD-10-CM | POA: Insufficient documentation

## 2021-12-15 MED ORDER — TRAMADOL HCL 50 MG PO TABS: 50.0000 mg | ORAL_TABLET | Freq: Four times a day (QID) | ORAL | 0 refills | Status: AC | PRN

## 2021-12-15 NOTE — Progress Notes (Signed)
New Patient Visit  Assessment: Joanna Perry is a 51 y.o. female with the following: 1. Pain in thoracic spine  Plan: [name] has pain in the upper back.  Radiographs are normal.  She has used a back brace in the past, and this was helpful, although it is too small.  She was fitted for a new back brace today.  We placed a referral for physical therapy.  Continue with current medications, and provided a limited prescription of tramadol.  Follow-up as needed.  Follow-up: Return if symptoms worsen or fail to improve.  Subjective:  Chief Complaint  Patient presents with   Back Pain    Entire back painful left hip painful too    History of Present Illness: Joanna Perry is a 51 y.o. female who presents for evaluation of upper back pain, after a fall.  She sustained a fall just a few days ago.  She notes pain between her shoulder blades.  This radiates distally.  She has some pain in the left thigh as well.  No bruising is appreciated.  She has been taking Tylenol, as well as methocarbamol.  This is providing limited improvement in her symptoms.   Review of Systems: No fevers or chills No numbness or tingling No chest pain No shortness of breath No bowel or bladder dysfunction No GI distress No headaches   Medical History:  Past Medical History:  Diagnosis Date   Gout    Hypertension    Kidney infection    Migraine    MRSA infection     Past Surgical History:  Procedure Laterality Date   left leg     MRSA   TRIGGER FINGER RELEASE Bilateral 07/20/2021   Procedure: Right thumb trigger finger release, Left long finger trigger release;  Surgeon: Garald Balding, MD;  Location: [facility];  Service: Orthopedics;  Laterality: Bilateral;    No family history on file. Social History   Tobacco Use   Smoking status: Never   Smokeless tobacco: Never  Vaping Use   Vaping Use: Never used  Substance Use Topics   Alcohol use: No     Comment: once a year   Drug use: No    Types: Cocaine    Allergies  Allergen Reactions   Codeine Itching   Hydrocodone-Acetaminophen Itching    Can take Oxycodone/Percocet   Other Swelling    pecans   Peach [Prunus Persica] Hives   Strawberry Extract Hives   Toradol [Ketorolac Tromethamine] Itching   Tramadol Itching and Hives   Latex Rash    Current Meds  Medication Sig   cloNIDine (CATAPRES) 0.1 MG tablet Take 0.1 mg by mouth 2 (two) times daily.   CYMBALTA 60 MG capsule Take 60 mg by mouth daily.   gabapentin (NEURONTIN) 300 MG capsule 1 capsule   hydrochlorothiazide (HYDRODIURIL) 25 MG tablet Take 25 mg by mouth daily.   hydrOXYzine (VISTARIL) 50 MG capsule Take 50 mg by mouth 3 (three) times daily as needed.   omeprazole (PRILOSEC) 20 MG capsule Take 20 mg by mouth daily.   oxyCODONE (ROXICODONE) 5 MG immediate release tablet Take 1 tablet (5 mg total) by mouth every 4 (four) hours as needed for severe pain.   QUEtiapine (SEROQUEL) 25 MG tablet Take 25 mg by mouth 2 (two) times daily.   traMADol (ULTRAM) 50 MG tablet Take 1 tablet (50 mg total) by mouth every 6 (six) hours as needed.    Objective: Ht '5\' 1"'$  (  1.549 m)   Wt 176 lb (79.8 kg)   LMP 01/01/2015   BMI 33.25 kg/m   Physical Exam:  General: Alert and oriented. and No acute distress. Gait: Normal gait.  No bruising of the back.  No bruising over the lateral hip.  Sensation is intact in bilateral lower extremities.  Good strength in all extremities.  Toes warm and well-perfused.  IMAGING: I personally ordered and reviewed the following images  Thoracic spine x-rays were obtained in clinic today.  No acute injuries noted.  No anterolisthesis.  She does demonstrate some kyphosis, beyond the what would be deemed as normal.  Impression: Thoracic spine x-rays with kyphosis, no acute injuries.   New Medications:  Meds ordered this encounter  Medications   traMADol (ULTRAM) 50 MG tablet    Sig: Take 1  tablet (50 mg total) by mouth every 6 (six) hours as needed.    Dispense:  30 tablet    Refill:  0      Joanna Rasmussen, MD  12/15/2021 4:23 PM

## 2022-01-10 ENCOUNTER — Telehealth: Payer: Self-pay

## 2022-01-10 NOTE — Telephone Encounter (Signed)
Returned call to client who had left a message about needing transportation for 01/14/22 to [facility]. Attempted to call back to confirm appointment and pick up location. No answer, left message requesting a return call.   [place] [employer]

## 2022-01-27 ENCOUNTER — Ambulatory Visit (HOSPITAL_COMMUNITY): Payer: Medicaid Other | Attending: Orthopedic Surgery

## 2022-03-02 ENCOUNTER — Telehealth: Payer: Self-pay

## 2022-03-02 NOTE — Telephone Encounter (Signed)
Client called needing transportation to [facility] for 03/03/22 at 10:00 am for IOP. Transportation arranged with Randolm Idol address of pick up to be [place] in [place]. Request sent via secure email to [facility]

## 2022-03-23 DIAGNOSIS — R69 Illness, unspecified: Secondary | ICD-10-CM | POA: Diagnosis not present

## 2022-03-23 DIAGNOSIS — S6981XA Other specified injuries of right wrist, hand and finger(s), initial encounter: Secondary | ICD-10-CM | POA: Diagnosis not present

## 2022-03-30 ENCOUNTER — Ambulatory Visit (INDEPENDENT_AMBULATORY_CARE_PROVIDER_SITE_OTHER): Payer: Self-pay | Admitting: Orthopedic Surgery

## 2022-03-30 ENCOUNTER — Encounter: Payer: Self-pay | Admitting: Orthopedic Surgery

## 2022-03-30 VITALS — Ht 61.0 in | Wt 176.0 lb

## 2022-03-30 DIAGNOSIS — S62634A Displaced fracture of distal phalanx of right ring finger, initial encounter for closed fracture: Secondary | ICD-10-CM

## 2022-03-30 DIAGNOSIS — M20011 Mallet finger of right finger(s): Secondary | ICD-10-CM
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
## 2022-04-13 ENCOUNTER — Encounter: Payer: Self-pay | Admitting: Orthopedic Surgery

## 2022-04-13 ENCOUNTER — Ambulatory Visit (INDEPENDENT_AMBULATORY_CARE_PROVIDER_SITE_OTHER): Payer: Self-pay | Admitting: Orthopedic Surgery

## 2022-04-13 ENCOUNTER — Ambulatory Visit (INDEPENDENT_AMBULATORY_CARE_PROVIDER_SITE_OTHER): Payer: Self-pay

## 2022-04-13 VITALS — Ht 61.0 in | Wt 176.0 lb

## 2022-04-13 DIAGNOSIS — M20011 Mallet finger of right finger(s): Secondary | ICD-10-CM

## 2022-04-13 DIAGNOSIS — S62636A Displaced fracture of distal phalanx of right little finger, initial encounter for closed fracture: Secondary | ICD-10-CM

## 2022-04-13 DIAGNOSIS — S62639A Displaced fracture of distal phalanx of unspecified finger, initial encounter for closed fracture: Secondary | ICD-10-CM

## 2022-04-13 NOTE — Progress Notes (Addendum)
Return Patient Visit  Assessment: Joanna Perry is a 51 y.o. female with the following: 1. Closed fracture of distal phalanx of finger of right hand with mallet deformity  Plan: Joanna Perry continues to have swelling in the distal aspect of the right ring finger.  Radiographs are unchanged compared to prior x-rays.  She still has a residual extensor lag.  I stressed the importance of giving this some time to potentially heal.  Even if it heals well, she may still have an extensor lag.  She did not feel as though the stack splint was providing sufficient protection, so we elected to try an AlumaFoam splint in order to maintain full extension at the DIP joint.  Follow-up in 3 weeks for repeat evaluation.  Follow-up: Return in about 3 weeks (around 05/04/2022).  Subjective:  Chief Complaint  Patient presents with   Fracture    Rt hand ring finger DOI 03/22/22    History of Present Illness: Joanna Perry is a 51 y.o. female who returns for evaluation of her right ring finger.  She sustained a mallet type injury approximately 3 weeks ago.  She has been using a stack splint.  She states that it puts too much pressure on her finger, and it is uncomfortable.  She continues to have pain, and NSAIDs are not providing sufficient relief.  Review of Systems: No fevers or chills No numbness or tingling No chest pain No shortness of breath No bowel or bladder dysfunction No GI distress No headaches   Objective: Ht '5\' 1"'$  (1.549 m)   Wt 176 lb (79.8 kg)   LMP 01/01/2015   BMI 33.25 kg/m   Physical Exam:  General: Alert and oriented. and No acute distress. Gait: Normal gait.  Right ring finger with some residual swelling distally.  Minimal bruising.  She is unable to actively extend the DIP joint to full extension.  Passively, this can be straightened.  She has tenderness to palpation about the DIP joint.  Brisk capillary refill.  Sensations intact to the tip of the  finger.  IMAGING: I personally reviewed images previously obtained from the ED  X-rays of the right hand were obtained in clinic today.  A small avulsion fracture of the dorsal base of the distal phalanx to the ring finger remains.  There has been no interval displacement.  Joint remains reduced.  No additional injuries are noted.  Impression: Persistent avulsion fracture off the dorsal base of the distal phalanx of the right ring finger   New Medications:  No orders of the defined types were placed in this encounter.     Mordecai Rasmussen, MD  05/03/2022 2:47 PM

## 2022-04-14 ENCOUNTER — Encounter: Payer: Self-pay | Admitting: Orthopedic Surgery

## 2022-05-04 ENCOUNTER — Encounter: Payer: Medicaid Other | Admitting: Orthopedic Surgery

## 2022-05-09 DIAGNOSIS — I10 Essential (primary) hypertension: Secondary | ICD-10-CM | POA: Diagnosis not present

## 2022-05-09 DIAGNOSIS — K5790 Diverticulosis of intestine, part unspecified, without perforation or abscess without bleeding: Secondary | ICD-10-CM | POA: Diagnosis not present

## 2022-05-09 DIAGNOSIS — R109 Unspecified abdominal pain: Secondary | ICD-10-CM | POA: Diagnosis not present

## 2022-05-09 DIAGNOSIS — Z91018 Allergy to other foods: Secondary | ICD-10-CM | POA: Diagnosis not present

## 2022-05-09 DIAGNOSIS — K219 Gastro-esophageal reflux disease without esophagitis: Secondary | ICD-10-CM | POA: Diagnosis not present

## 2022-10-21 ENCOUNTER — Telehealth: Payer: Self-pay | Admitting: Orthopedic Surgery

## 2022-10-21 NOTE — Telephone Encounter (Signed)
Patient called left message to get referral to a spine surgeon I called her back left message for her to call back

## 2022-11-03 ENCOUNTER — Encounter: Payer: Self-pay | Admitting: Orthopaedic Surgery

## 2022-11-03 ENCOUNTER — Other Ambulatory Visit (INDEPENDENT_AMBULATORY_CARE_PROVIDER_SITE_OTHER): Payer: Medicaid Other

## 2022-11-03 ENCOUNTER — Ambulatory Visit (INDEPENDENT_AMBULATORY_CARE_PROVIDER_SITE_OTHER): Payer: Medicaid Other | Admitting: Orthopaedic Surgery

## 2022-11-03 VITALS — Ht 61.0 in | Wt 167.0 lb

## 2022-11-03 DIAGNOSIS — Z87898 Personal history of other specified conditions: Secondary | ICD-10-CM | POA: Diagnosis not present

## 2022-11-03 DIAGNOSIS — M545 Low back pain, unspecified: Secondary | ICD-10-CM

## 2022-11-03 DIAGNOSIS — M542 Cervicalgia: Secondary | ICD-10-CM

## 2022-11-03 MED ORDER — PREDNISONE 10 MG (21) PO TBPK: ORAL_TABLET | ORAL | 0 refills | Status: AC
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
## 2022-12-15 ENCOUNTER — Ambulatory Visit: Payer: Medicaid Other | Admitting: Orthopaedic Surgery

## 2023-01-16 ENCOUNTER — Emergency Department (HOSPITAL_COMMUNITY)
Admission: EM | Admit: 2023-01-16 | Discharge: 2023-01-17 | Disposition: A | Discharge status: Home / Self Care | Payer: MEDICAID | Attending: Student | Admitting: Student

## 2023-01-16 ENCOUNTER — Emergency Department (HOSPITAL_COMMUNITY): Payer: MEDICAID

## 2023-01-16 ENCOUNTER — Other Ambulatory Visit: Payer: Self-pay

## 2023-01-16 ENCOUNTER — Encounter (HOSPITAL_COMMUNITY): Payer: Self-pay | Admitting: Emergency Medicine

## 2023-01-16 DIAGNOSIS — Z01419 Encounter for gynecological examination (general) (routine) without abnormal findings: Secondary | ICD-10-CM | POA: Diagnosis not present

## 2023-01-16 DIAGNOSIS — Z79899 Other long term (current) drug therapy: Secondary | ICD-10-CM | POA: Insufficient documentation

## 2023-01-16 DIAGNOSIS — I10 Essential (primary) hypertension: Secondary | ICD-10-CM | POA: Insufficient documentation

## 2023-01-16 DIAGNOSIS — Z91040 Latex allergy status: Secondary | ICD-10-CM | POA: Diagnosis not present

## 2023-01-16 LAB — BASIC METABOLIC PANEL
Anion gap: 12 (ref 5–15)
BUN: 10 mg/dL (ref 6–20)
CO2: 20 mmol/L — ABNORMAL LOW (ref 22–32)
Calcium: 8.9 mg/dL (ref 8.9–10.3)
Chloride: 104 mmol/L (ref 98–111)
Creatinine, Ser: 0.85 mg/dL (ref 0.44–1.00)
GFR, Estimated: >60 mL/min (ref >60)
Glucose, Bld: 107 mg/dL — ABNORMAL HIGH (ref 70–99)
Potassium: 3.9 mmol/L (ref 3.5–5.1)
Sodium: 136 mmol/L (ref 135–145)

## 2023-01-16 LAB — CBC
HCT: 44.8 % (ref 36.0–46.0)
Hemoglobin: 14.8 g/dL (ref 12.0–15.0)
MCH: 29.2 pg (ref 26.0–34.0)
MCHC: 33 g/dL (ref 30.0–36.0)
MCV: 88.5 fL (ref 80.0–100.0)
Platelets: 350 10*3/uL (ref 150–400)
RBC: 5.06 MIL/uL (ref 3.87–5.11)
RDW: 13.1 % (ref 11.5–15.5)
WBC: 8 10*3/uL (ref 4.0–10.5)
nRBC: 0 % (ref 0.0–0.2)

## 2023-01-16 LAB — TROPONIN I (HIGH SENSITIVITY)
Troponin I (High Sensitivity): 2 ng/L (ref <18)
Troponin I (High Sensitivity): 3 ng/L (ref <18)
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
## 2023-01-16 NOTE — ED Notes (Signed)
Urine sample provided if needed in triage

## 2023-01-16 NOTE — ED Provider Notes (Addendum)
[place] [facility] AT [facility] Provider Note  CSN: [id number] Arrival date & time: 01/16/23 1927  Chief Complaint(s) Foreign Body in Vagina (Chest Pain)  HPI LAMETRA DEPALMA is a 52 y.o. female with PMH gout, HTN, MRSA, cocaine dependence, depression who presents emergency departments in police custody due to concern for possible foreign body in the vagina.  Patient was reportedly arrested today for failure to appear in court and felt like she needed to vomit while in jail.  While in jail, she made a comment to officers stating "you can get anything into jail as long as is not metal".  She was then scanned in jail where they thought they may have seen a foreign body and transferred her to the emergency department.  She voiced some chest discomfort to triage nurses on arrival but on my evaluation she is not endorsing any chest pain at all.  She currently denying shortness of breath, abdominal pain, nausea, vomiting or any other systemic symptoms.  Additional history obtained from police state that patient did use the bathroom unsupervised since the scan was performed and may have retrieved the foreign body prior to my exam.   Past Medical History Past Medical History:  Diagnosis Date   Gout    Hypertension    Kidney infection    Migraine    MRSA infection    Patient Active Problem List   Diagnosis Date Noted   Chest pain 12/15/2021   Body mass index (BMI) 31.0-31.9, adult 12/15/2021   Anxiety 12/15/2021   Other long term (current) drug therapy 12/15/2021   Severe recurrent major depression without psychotic features (HCC) 12/15/2021   Cocaine dependence (HCC) 08/29/2021   Essential hypertension 08/29/2021   History of crack cocaine use 08/29/2021   Trigger finger, left middle finger 08/05/2020   Trigger finger of right thumb 08/05/2020   Unspecified fracture of shaft of left ulna, subsequent encounter for closed fracture with delayed healing 03/15/2017    Closed fracture of right forearm 01/11/2017   Home Medication(s) Prior to Admission medications   Medication Sig Start Date End Date Taking? Authorizing Provider  cloNIDine (CATAPRES) 0.1 MG tablet Take 0.1 mg by mouth 2 (two) times daily.    [provider]  CYMBALTA 60 MG capsule Take 60 mg by mouth daily. 03/31/21   [provider]  gabapentin (NEURONTIN) 300 MG capsule 1 capsule 10/13/20   [provider]  hydrochlorothiazide (HYDRODIURIL) 25 MG tablet Take 25 mg by mouth daily.    [provider]  hydrOXYzine (VISTARIL) 50 MG capsule Take 50 mg by mouth 3 (three) times daily as needed.    [provider]  omeprazole (PRILOSEC) 20 MG capsule Take 20 mg by mouth daily.    [provider]  oxyCODONE (ROXICODONE) 5 MG immediate release tablet Take 1 tablet (5 mg total) by mouth every 4 (four) hours as needed for severe pain. 07/20/21   Jetty Peeks, PA-C  predniSONE (STERAPRED UNI-PAK 21 TAB) 10 MG (21) TBPK tablet Take 6,5,4,3,2,1 one tablet less each day with food 11/03/22   Eldred Manges, MD  QUEtiapine (SEROQUEL) 25 MG tablet Take 25 mg by mouth 2 (two) times daily. 04/06/21   [provider]  traMADol (ULTRAM) 50 MG tablet Take 1 tablet (50 mg total) by mouth every 6 (six) hours as needed. 12/15/21   Oliver Barre, MD  Past Surgical History Past Surgical History:  Procedure Laterality Date   left leg     MRSA   TRIGGER FINGER RELEASE Bilateral 07/20/2021   Procedure: Right thumb trigger finger release, Left long finger trigger release;  Surgeon: Valeria Batman, MD;  Location: [facility];  Service: Orthopedics;  Laterality: Bilateral;   Family History History reviewed. No pertinent family history.  Social History Social History   Tobacco Use   Smoking status: Never    Smokeless tobacco: Never  Vaping Use   Vaping status: Never Used  Substance Use Topics   Alcohol use: No    Comment: once a year   Drug use: No    Types: Cocaine   Allergies Codeine, Hydrocodone-acetaminophen, Other, Peach [prunus persica], Strawberry extract, Toradol [ketorolac tromethamine], Tramadol, and Latex  Review of Systems Review of Systems  All other systems reviewed and are negative.   Physical Exam Vital Signs  I have reviewed the triage vital signs BP (!) 183/110 (BP Location: Right Arm)   Pulse 74   Temp 98.5 F (36.9 C) (Oral)   Resp 16   Ht 5\' 1"  (1.549 m)   Wt 77.1 kg   LMP 01/01/2015   SpO2 97%   BMI 32.12 kg/m   Physical Exam Vitals and nursing note reviewed.  Constitutional:      General: She is not in acute distress.    Appearance: She is well-developed.  HENT:     Head: Normocephalic and atraumatic.  Eyes:     Conjunctiva/sclera: Conjunctivae normal.  Cardiovascular:     Rate and Rhythm: Normal rate and regular rhythm.     Heart sounds: No murmur heard. Pulmonary:     Effort: Pulmonary effort is normal. No respiratory distress.     Breath sounds: Normal breath sounds.  Abdominal:     Palpations: Abdomen is soft.     Tenderness: There is no abdominal tenderness.  Genitourinary:    Comments: No foreign body seen in vagina Musculoskeletal:        General: No swelling.     Cervical back: Neck supple.  Skin:    General: Skin is warm and dry.     Capillary Refill: Capillary refill takes less than 2 seconds.  Neurological:     Mental Status: She is alert.  Psychiatric:        Mood and Affect: Mood normal.     ED Results and Treatments Labs (all labs ordered are listed, but only abnormal results are displayed) Labs Reviewed  BASIC METABOLIC PANEL - Abnormal; Notable for the following components:      Result Value   CO2 20 (*)    Glucose, Bld 107 (*)    All other components within normal limits  CBC  RAPID URINE DRUG SCREEN, HOSP  PERFORMED  TROPONIN I (HIGH SENSITIVITY)  TROPONIN I (HIGH SENSITIVITY)  Radiology DG Chest 2 View  Result Date: 01/16/2023 CLINICAL DATA:  Chest pain EXAM: CHEST - 2 VIEW COMPARISON:  Chest x-ray 05/02/2020 FINDINGS: The heart size and mediastinal contours are within normal limits. Both lungs are clear. The visualized skeletal structures are unremarkable. IMPRESSION: No active cardiopulmonary disease. Electronically Signed   By: Darliss Cheney M.D.   On: 01/16/2023 21:45    Pertinent labs & imaging results that were available during my care of the patient were reviewed by me and considered in my medical decision making (see MDM for details).  Medications Ordered in ED Medications - No data to display                                                                                                                                   Procedures Procedures  (including critical care time)  Medical Decision Making / ED Course   This patient presents to the ED for concern of foreign body in vagina, this involves an extensive number of treatment options, and is a complaint that carries with it a high risk of complications and morbidity.  The differential diagnosis includes foreign body, secondary gain, normal pelvic exam, STI  MDM: Patient seen emergency room for evaluation of a possible pelvic foreign body.  Physical exam including pelvic exam is unremarkable with no foreign body seen in the vaginal canal.  Cervix visualized with no evidence of cervicitis or displacement of the cervix and low concern for foreign body in the vaginal fornix.  Patient does arrive hypertensive but she does take clonidine twice daily as well as hydrochlorothiazide and has not had her nighttime dosing today.   Low suspicion for intravaginal cocaine intoxication or other sympathomimetic intoxication  nonvisualized foreign body.  I suspect that patient either has no foreign body or she self retrieved this foreign body prior to my exam.  Laboratory evaluation obtained in triage given patient's initial report of chest pain that shows normal troponin, normal chest x-ray, and nonischemic ECG.  At this time she does not meet inpatient criteria for admission she is safe for discharge with outpatient follow-up.   Additional history obtained: -Additional history obtained from police -External records from outside source obtained and reviewed including: Chart review including previous notes, labs, imaging, consultation notes   Lab Tests: -I ordered, reviewed, and interpreted labs.   The pertinent results include:   Labs Reviewed  BASIC METABOLIC PANEL - Abnormal; Notable for the following components:      Result Value   CO2 20 (*)    Glucose, Bld 107 (*)    All other components within normal limits  CBC  RAPID URINE DRUG SCREEN, HOSP PERFORMED  TROPONIN I (HIGH SENSITIVITY)  TROPONIN I (HIGH SENSITIVITY)      EKG   EKG Interpretation Date/Time:  Monday January 16 2023 20:16:24 EDT Ventricular Rate:  75 PR Interval:  112 QRS Duration:  72 QT Interval:  416  QTC Calculation: 464 R Axis:   21  Text Interpretation: Normal sinus rhythm Normal ECG When compared with ECG of 14-Jul-2021 12:59, No significant change was found Confirmed by Kinzlie Harney ([id number]) on 01/17/2023 12:03:22 AM         Imaging Studies ordered: I ordered imaging studies including chest x-ray I independently visualized and interpreted imaging. I agree with the radiologist interpretation   Medicines ordered and prescription drug management: No orders of the defined types were placed in this encounter.   -I have reviewed the patients home medicines and have made adjustments as needed  Critical interventions none    Cardiac Monitoring: The patient was maintained on a cardiac monitor.  I personally viewed  and interpreted the cardiac monitored which showed an underlying rhythm of: NSR  Social Determinants of Health:  Factors impacting patients care include: Currently in please custody   Reevaluation: After the interventions noted above, I reevaluated the patient and found that they have :improved  Co morbidities that complicate the patient evaluation  Past Medical History:  Diagnosis Date   Gout    Hypertension    Kidney infection    Migraine    MRSA infection       Dispostion: I considered admission for this patient, but at this time she does not meet inpatient criteria for admission and she is safe for discharge with outpatient follow-up     Final Clinical Impression(s) / ED Diagnoses Final diagnoses:  Normal pelvic exam     @PCDICTATION @    Glendora Score, MD 01/17/23 0004    Glendora Score, MD 01/17/23 0005

## 2023-09-21 ENCOUNTER — Encounter: Payer: Self-pay | Admitting: Orthopaedic Surgery

## 2023-09-21 ENCOUNTER — Ambulatory Visit: Payer: MEDICAID | Admitting: Orthopaedic Surgery

## 2023-09-21 ENCOUNTER — Other Ambulatory Visit: Payer: Self-pay

## 2023-09-21 DIAGNOSIS — Z87898 Personal history of other specified conditions: Secondary | ICD-10-CM | POA: Diagnosis not present

## 2023-09-21 DIAGNOSIS — M545 Low back pain, unspecified: Secondary | ICD-10-CM

## 2023-09-21 MED ORDER — GABAPENTIN 300 MG PO CAPS: 300.0000 mg | ORAL_CAPSULE | Freq: Three times a day (TID) | ORAL | 5 refills | Status: AC

## 2023-09-21 NOTE — Progress Notes (Signed)
 My back hurts  She has had increasing pain of the lower back over the last few months.  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  Patient has no paresthesias, pain localized to the lower back, no numbness, no other trauma.  ROM of the back is good, NV intact, Toe/heel gait normal, muscle tone and strength normal, SLR negative.  X-rays of the lumbar spine were done reported separately.  She has normal lumbar lordosis, good spaces between vertebra, no fracture.  Bone quality is good.  Encounter Diagnoses  Name Primary?   Pain of lumbar spine Yes   History of crack cocaine use    I will begin PT for the lower back.  She requests Toradol  but I told her I do not Rx for patients that have had illicit drug use.   She understands reluctantly.  I will restart her Gabapentin  which she said helped.  Return in one month.  Call if any problem.  Precautions discussed.  Electronically Signed Pleasant Brilliant, MD 4/24/202512:02 PM

## 2023-10-19 ENCOUNTER — Ambulatory Visit: Payer: MEDICAID | Admitting: Orthopaedic Surgery

## 2023-10-26 ENCOUNTER — Ambulatory Visit: Payer: MEDICAID | Admitting: Orthopaedic Surgery

## 2024-01-18 NOTE — Congregational Nurse Program (Signed)
 Pt participated in [place] [employer] event  on 01/13/2024 by way of requesting a blood pressure screening  Pt states she currently is on BP meds and would like to inquire on her reading   Plan -checked BP and was 138/90 with HR of 98 -Provided pt with BP handout on how to improve BP numbers -Provided pt wih blood pressure log as reminder to record bp readings to be able to discuss with her PCP -pt encouraged to self monitor at home
